# Patient Record
Sex: Female | Born: 1970 | Hispanic: Yes | Marital: Single | Smoking: Never smoker
Health system: Southern US, Community
[De-identification: ages and names within clinical notes are randomized; demographics above are authoritative.]

## PROBLEM LIST (undated history)

## (undated) DIAGNOSIS — G43909 Migraine, unspecified, not intractable, without status migrainosus: Secondary | ICD-10-CM

## (undated) DIAGNOSIS — E782 Mixed hyperlipidemia: Principal | ICD-10-CM

## (undated) DIAGNOSIS — E890 Postprocedural hypothyroidism: Principal | ICD-10-CM

## (undated) DIAGNOSIS — M059 Rheumatoid arthritis with rheumatoid factor, unspecified: Secondary | ICD-10-CM

## (undated) DIAGNOSIS — N950 Postmenopausal bleeding: Secondary | ICD-10-CM

## (undated) DIAGNOSIS — E559 Vitamin D deficiency, unspecified: Secondary | ICD-10-CM

## (undated) DIAGNOSIS — R928 Other abnormal and inconclusive findings on diagnostic imaging of breast: Secondary | ICD-10-CM

## (undated) DIAGNOSIS — R61 Generalized hyperhidrosis: Secondary | ICD-10-CM

## (undated) DIAGNOSIS — F419 Anxiety disorder, unspecified: Secondary | ICD-10-CM

## (undated) DIAGNOSIS — J45909 Unspecified asthma, uncomplicated: Secondary | ICD-10-CM

## (undated) DIAGNOSIS — Z0000 Encounter for general adult medical examination without abnormal findings: Secondary | ICD-10-CM

## (undated) DIAGNOSIS — G8918 Other acute postprocedural pain: Secondary | ICD-10-CM

## (undated) DIAGNOSIS — G4700 Insomnia, unspecified: Secondary | ICD-10-CM

## (undated) DIAGNOSIS — M069 Rheumatoid arthritis, unspecified: Secondary | ICD-10-CM

## (undated) DIAGNOSIS — D259 Leiomyoma of uterus, unspecified: Secondary | ICD-10-CM

## (undated) DIAGNOSIS — E0590 Thyrotoxicosis, unspecified without thyrotoxic crisis or storm: Secondary | ICD-10-CM

## (undated) DIAGNOSIS — F32A Depression, unspecified: Secondary | ICD-10-CM

## (undated) DIAGNOSIS — F329 Major depressive disorder, single episode, unspecified: Secondary | ICD-10-CM

## (undated) DIAGNOSIS — I10 Essential (primary) hypertension: Secondary | ICD-10-CM

## (undated) HISTORY — DX: Anxiety disorder, unspecified: F41.9

## (undated) HISTORY — PX: TUBAL LIGATION: SHX77

## (undated) HISTORY — DX: Depression, unspecified: F32.A

## (undated) HISTORY — DX: Unspecified asthma, uncomplicated: J45.909

## (undated) HISTORY — DX: Essential (primary) hypertension: I10

## (undated) HISTORY — DX: Thyrotoxicosis, unspecified without thyrotoxic crisis or storm: E05.90

---
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
## 2012-07-22 NOTE — Progress Notes (Signed)
Beonca Gibb  07/22/2012      Chief Complaint   Patient presents with   ??? Annual Exam     new patient.Arlee Muslim order needed..pt accepts hpv testing and cultures       HPI: England Greb is a 42 y.o. female G15P0     Past Medical History   Diagnosis Date   ??? Hypertension    ??? Asthma    ??? Headache(784.0)      Past Surgical History   Procedure Laterality Date   ??? Thyroid surgery       06/09/2012   ??? Dental surgery     ??? Tubal ligation       History     Social History   ??? Marital Status: Single     Spouse Name: N/A     Number of Children: N/A   ??? Years of Education: N/A     Occupational History   ??? Not on file.     Social History Main Topics   ??? Smoking status: Never Smoker    ??? Smokeless tobacco: Never Used   ??? Alcohol Use: 0.0 oz/week      Comment: ocassionally   ??? Drug Use: No   ??? Sexual Activity:     Partners: Male     Other Topics Concern   ??? Not on file     Social History Narrative            Medications:  No current outpatient prescriptions on file prior to visit.     No current facility-administered medications on file prior to visit.         Allergies:  Allergies as of 07/22/2012   ??? (No Known Allergies)       No specialty comments available.    OB History   Gravida Para Term Preterm AB SAB TAB Ectopic Multiple Living   5        0 4      # Outcome Date GA Lbr Len/2nd Weight Sex Delivery Anes PTL Lv   5 Gravida 09/11/89    M Vag-Spont   Y   4 Gravida 06/12/87    F Vag-Spont   Y   3 Gravida 02/01/85    M Vag-Spont   Y   2 Gravida 10/17/83    F Vag-Spont   Y   1 Gravida 08/11/81    F Vag-Spont             Review Of Systems:    CONSTITUTIONAL:  negative  EYES:  negative  HEENT:  negative  RESPIRATORY:  negative  CARDIOVASCULAR:  negative  GASTROINTESTINAL:  negative  GENITOURINARY:  negative  INTEGUMENT/BREAST:  negative  HEMATOLOGIC/LYMPHATIC:  negative  ALLERGIC/IMMUNOLOGIC:  negative  ENDOCRINE:  negative  MUSCULOSKELETAL:  negative  NEUROLOGICAL:  negative  BEHAVIOR/PSYCH:  negative    Blood pressure 108/75, pulse  65, weight 160 lb (72.576 kg), last menstrual period 06/29/2012.    General Appearance:  awake, alert, oriented, in no acute distress and well developed, well nourished  Skin:  Skin color, texture, turgor normal. No rashes or lesions.  Mouth/Throat:  deffered  Neck:  neck- supple, no mass, non-tender  Lungs:  No chest wall tenderness.  Heart:  Heart regular rate and rhythm  Breast:  negative, Inspection negative. No nipple discharge or bleeding. No palpable mass, No skin changes or dimpling  Abdomen:  Soft, non-tender, normal bowel sounds.  No bruits, organomegaly or masses.  Extremities: Extremities warm to touch, pink, with  no edema.  Musculoskeletal:  negative  Peripheral Pulses:  Normal  Neurologic:  negative  Female Urogen:  External genitalia, vagina and cervix appear normal and without lesions.  Bimanual exam: no adnexal masses, uterine enlargement or tenderness noted.  Pap obtained.  Exam chaperoned by female MA/Nurse  External genitalia: normal and normal Bartholin, urethral and Skein's glands  Vagina: normal mucosa without prolapse or lesions  Cervix:  no vaginal/perineal skin rash, uterus anteverted, no cervical motion tenderness, no adnexal mass or tenderness, no discharge, and no lesions  Uterus: normal  Adnexae: Normal.  No palpable masses  Female Rectal: Rectal exam deferred      Diagnostics:  na      Lab Results:  No results found for this or any previous visit.            Assessment:   1. Other screening mammogram  MAM Screening Bilateral   2. Routine gynecological examination  PAP SMEAR       There is no problem list on file for this patient.           Plan:  No Follow-up on file.    Annual done /2014  STD and Barrier contraception recommendations reviewed  Preventative Health Maintenance recommendations reviewed  Orders Placed This Encounter   Procedures   ??? MAM Screening Bilateral   ??? PAP SMEAR       Patient was seen with total face to face time of 45 minutes. More than 50% of this visit was on  counseling and education regarding her   1. Other screening mammogram  MAM Screening Bilateral   2. Routine gynecological examination  PAP SMEAR    and her options. She was also counseled on her preventative health maintenance recommendations and follow-up.

## 2012-09-24 NOTE — Progress Notes (Signed)
Results are finalized in the chart  PT HAS 3 CHARTS. MAMMOGRAM RESULTS ARE IN MRN# [id number]

## 2013-07-12 MED ORDER — DOXYCYCLINE MONOHYDRATE 100 MG PO TABS
100 MG | ORAL_TABLET | Freq: Two times a day (BID) | ORAL | Status: AC
Start: 2013-07-12 — End: 2013-07-22

## 2013-07-12 NOTE — Progress Notes (Signed)
Abnormal Uterine Bleeding      Rebecca Lamb is a 43 y.o. woman G5P5005 who presents with complaints of abnormal uterine bleeding as characterized by daily vaginal bleeding for the past month, associated occasionally with blood clots.  The patient has tried antiinflammatories and hormonal interventions with a pain score of dull aching. Bleeding is associated with anxiety and menses. Periods are regular every 30 dys days, lasting 3 days. Dysmenorrhea:mild, occurring throughout menses. Current contraception: none. Tubal ligation in 1991.     Vitals:  BP 114/79    Pulse 79    Ht 5' (1.524 m)    Wt 166 lb (75.297 kg)    BMI 32.42 kg/m2      LMP 06/17/2013     Allergies:  Review of patient's allergies indicates no known allergies.  Past Medical History   Diagnosis Date   ??? Asthma    ??? Migraine    ??? Back pain      Past Surgical History   Procedure Laterality Date   ??? Thyroid surgery       OB History    No data available        Family History   Problem Relation Age of Onset   ??? Cancer Father      brain and lung cancer   ??? Breast Cancer Maternal Aunt    ??? Breast Cancer Paternal Aunt    ??? Breast Cancer Paternal Grandmother    ??? Cervical Cancer Neg Hx    ??? Ovarian Cancer Neg Hx    ??? Uterine Cancer Neg Hx      History     Social History   ??? Marital Status: Married     Spouse Name: N/A     Number of Children: N/A   ??? Years of Education: N/A     Occupational History   ??? Not on file.     Social History Main Topics   ??? Smoking status: Never Smoker    ??? Smokeless tobacco: Never Used   ??? Alcohol Use: 0.0 oz/week     0 Not specified per week   ??? Drug Use: No   ??? Sexual Activity: Not Currently     Other Topics Concern   ??? Not on file     Social History Narrative   ??? No narrative on file       Patient's medications, allergies, past medical, surgical, social and family histories were reviewed and updated as appropriate.    Review of Systems  Review of Systems   Constitutional: Negative for fever and chills.   Respiratory: Negative for  cough and shortness of breath.    Cardiovascular: Negative for chest pain and palpitations.   Gastrointestinal: Negative for nausea and vomiting.   Genitourinary: Negative for dysuria, urgency and frequency.        Vaginal bleeding   Musculoskeletal: Negative for myalgias.   Neurological: Negative for dizziness, seizures and headaches.   Psychiatric/Behavioral: Negative for depression and suicidal ideas.     All other systems reviewed and are negative.    Physical Exam  Physical Exam   Constitutional: She is oriented to person, place, and time and well-developed, well-nourished, and in no distress.   HENT:   Head: Normocephalic and atraumatic.   Eyes: Conjunctivae are normal. Pupils are equal, round, and reactive to light.   Neck: Normal range of motion. Neck supple.   Cardiovascular: Normal rate and regular rhythm.    Pulmonary/Chest: Effort normal and breath sounds normal.  No respiratory distress.   Abdominal: Soft. Bowel sounds are normal.   Genitourinary: Vagina normal, uterus normal and cervix normal. No vaginal discharge found.   Vaginal bleeding.    Musculoskeletal: Normal range of motion.   Neurological: She is alert and oriented to person, place, and time. She has normal reflexes.   Psychiatric: Memory, affect and judgment normal.     EMB  Procedure Details     Urine pregnancy test was done  and result was negative.  The risks (including infection, bleeding, pain, and uterine perforation) and benefits of the procedure were explained to the patient and Verbal informed consent was obtained.  Antibiotic prophylaxis against endocarditis was not indicated.     The patient was placed in the dorsal lithotomy position.  Bimanual exam showed the uterus to be in the retroflexed position.  A Graves' speculum inserted in the vagina, and the cervix prepped with betadine. A sharp tenaculum was applied to the anterior lip of the cervix for stabilization.  A sterile uterine sound was used to sound the uterus to a depth of  8cm.  A Pipelle endometrial aspirator was used to sample the endometrium.  Sample was sent for pathologic examination.         Assessment:     1. Menorrhagia  58100 - PR BIOPSY OF UTERUS LINING   2. Screening procedure  58100 - PR BIOPSY OF UTERUS LINING   3. Papanicolaou smear          Body mass index is 32.42 kg/(m^2).  Obesity:  Class I  Smoking:  No    Plan:   Scheduled pelvic US  Counseled about different treatments for menorrhagia.     Obesity Counseling:  Given  Smoking Counseling:  N/A      Orders Placed This Encounter   Procedures   ??? 58100 - PR BIOPSY OF UTERUS LINING       No orders of the defined types were placed in this encounter.         Follow up:  Return in about 1 week (around 07/19/2013).    Dewain Penning, MD

## 2013-07-12 NOTE — Addendum Note (Signed)
Addended by: Sheryn Bison on: 07/12/2013 02:27 PM     Modules accepted: Orders

## 2013-07-12 NOTE — Addendum Note (Signed)
Addended by: Bernette Mayers on: 07/12/2013 01:48 PM     Modules accepted: Orders

## 2013-07-16 NOTE — Progress Notes (Signed)
Results Review     Rebecca Lamb is a 43 y.o. year old female here to discuss  results.      Vitals:  BP 107/72    Pulse 67    Ht 5' (1.524 m)    Wt 169 lb (76.658 kg)    BMI 33.01 kg/m2      LMP 06/17/2013     Allergies:  Review of patient's allergies indicates no known allergies.  Past Medical History   Diagnosis Date   ??? Asthma    ??? Migraine    ??? Back pain      Past Surgical History   Procedure Laterality Date   ??? Thyroid surgery       OB History    No data available        Family History   Problem Relation Age of Onset   ??? Cancer Father      brain and lung cancer   ??? Breast Cancer Maternal Aunt    ??? Breast Cancer Paternal Aunt    ??? Breast Cancer Paternal Grandmother    ??? Cervical Cancer Neg Hx    ??? Ovarian Cancer Neg Hx    ??? Uterine Cancer Neg Hx      History     Social History   ??? Marital Status: Divorced     Spouse Name: N/A     Number of Children: N/A   ??? Years of Education: N/A     Occupational History   ??? Not on file.     Social History Main Topics   ??? Smoking status: Never Smoker    ??? Smokeless tobacco: Never Used   ??? Alcohol Use: 0.0 oz/week     0 Not specified per week   ??? Drug Use: No   ??? Sexual Activity: Not Currently     Other Topics Concern   ??? Not on file     Social History Narrative       Contraceptive method:  none    Patient's medications, allergies, past medical, surgical, social and family histories were reviewed and updated as appropriate.    Review of Systems  Review of Systems   Constitutional: Negative for fever and chills.   Respiratory: Negative for cough and shortness of breath.    Cardiovascular: Negative for chest pain and palpitations.   Gastrointestinal: Negative for nausea and vomiting.   Genitourinary: Negative for dysuria, urgency and frequency.        Menorrhagia   Musculoskeletal: Negative for myalgias.   Neurological: Negative for dizziness, seizures and headaches.   Psychiatric/Behavioral: Negative for depression and suicidal ideas.      All other systems reviewed and are  negative.    Results:   [place]  The uterus measures 8.7 x 3.9 x 4.4 cm in diameter. The   uterus has a lobular contour. There is a subserosal solid mass, which   is isoechoic to uterine myometrium in the anterior uterine body towards   the fundus, which measures 2.4 x 2.4 x 1.4 cm in diameter. The   appearance is compatible with a subserosal fibroid. There is an   adjacent fibroid, which is intramural and subserosal and measures 2.3 x   2.6 x 2.5 cm in diameter. A third fibroid is identified posteriorly and   is subserosal in location. This measures 2.2 x 2.3 x 2.2 cm in   diameter. There is mild heterogenicity of the uterine myometrium. The   double-wall endometrial thickness is 3 mm.  EMB negative for malignancy      Assessment:     1. Menorrhagia    2. Fibroid uterus           Plan:   > 25 min face to face time  Discussed different treatment modalities for condition. Including expectant management,  medications, Mirena IUD , Novasure ablation and laparoscopic hysterectomy.   Patient wants definitive treatment : hysterectomy.   Patient mentions she has " a heart condition" .   Patient obtain medical clearance and return to schedule surgery .     No orders of the defined types were placed in this encounter.     No orders of the defined types were placed in this encounter.       Follow up:  Return if symptoms worsen or fail to improve, for after medical clearance. .    Risks, benefits and alternative therapies for treatment discussed. Pt elects desires hysterectomy for therapy.         Dewain Penning, MD

## 2016-10-10 ENCOUNTER — Ambulatory Visit (HOSPITAL_BASED_OUTPATIENT_CLINIC_OR_DEPARTMENT_OTHER): Payer: Medicaid Other | Admitting: Internal Medicine

## 2016-10-10 ENCOUNTER — Encounter (HOSPITAL_BASED_OUTPATIENT_CLINIC_OR_DEPARTMENT_OTHER): Payer: Self-pay

## 2016-11-20 LAB — HM MAMMOGRAPHY

## 2016-12-25 ENCOUNTER — Ambulatory Visit (HOSPITAL_BASED_OUTPATIENT_CLINIC_OR_DEPARTMENT_OTHER): Payer: Medicaid Other | Admitting: Rheumatology

## 2017-01-02 NOTE — H&P (Signed)
Reason for Visit: Prior diagnosis of RA with arthralgias    History of the Presenting Illness:  States that she was previously diagnosed with RA while in [place] about 5 years ago. Uncertain as to what kind/name of this MD who made the diagnosis. Was never started on a DMARD for unclear reasons. Notes that her pain symptoms vary with a change in the weather (worsen with a change in either direction of the temperature). Has lower back pain and standing in the office today due to this. However, also states that her right shoulder and right hand are sites of significant pain. Taking ibuprofen with a partial response of her sharp pain symptoms; has dyspepsia with the ibuprofen. Has tried massage and chiropractic manipulations in the past without significant improvement. Pain symptoms are worse with movement/activity. Has AM stiffness at affected sites.  States she has a high pain tolerance. No sensory changes. No focal weakness.    Denies rashes. No history of inflammatory eye disease. Denies significant SOB. Long-standing lower back pain than worsens with activity. Experience back pain more so than back stiffness. Back pain does not radiate into the legs       Review of Systems: Positive for headaches, anxiety, difficulties staying asleep. All others negative except as mentioned in the HPI.    Previous Medical History:  Hypertension  Hypothyroidism  Migraines  Asthma    Previous Surgical History:  None    Family History:  Father and brother with diabetes  Mother with asthma  Daughter with rheumatoid arthritis    Social History:  Non-smoker, rare EtOH, no recreational drug use. 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      Current Outpatient Medications:     albuterol (PROVENTIL) (2.5 MG/3ML) 0.083% nebulization, 2.5 mg by Nebulization route every 4 hours as needed for Wheezing., Disp: , Rfl:     EPINEPHrine (EPIPEN 2-PAK) 0.3 MG/0.3ML injection, 0.3 mg by IntraMUSCULAR route once as needed., Disp: , Rfl:      fluticasone propionate (FLONASE) 50 MCG/ACT nasal spray, Spray 1 spray into each nostril daily., Disp: , Rfl:     ibuprofen (MOTRIN) 800 MG tablet, Take 800 mg by mouth every 6 hours as needed for Mild Pain (Pain Score 1-3)., Disp: , Rfl:     levothyroxine (SYNTHROID) 175 MCG tablet, Take 175 mcg by mouth every morning (before breakfast)., Disp: , Rfl:     levothyroxine (SYNTHROID) 200 MCG tablet, Take 200 mcg by mouth every morning (before breakfast)., Disp: , Rfl:     methocarbamol (ROBAXIN) 500 MG tablet, Take 500 mg by mouth 3 times daily., Disp: , Rfl:     metoprolol tartrate (LOPRESSOR) 50 MG tablet, Take 50 mg by mouth 2 times daily., Disp: , Rfl:     montelukast (SINGULAIR) 10 MG tablet, Take 10 mg by mouth every evening., Disp: , Rfl:     sumatriptan (IMITREX) 100 MG tablet, Take 100 mg by mouth once as needed for Migraine. 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   Skin: No rash.       Lab Data and Imaging:  Lab Results   Component Value Date    WBC 10.6 (H) 01/03/2017    RBC 4.54 01/03/2017    HGB 14.3 01/03/2017    HCT 42.7 01/03/2017    MCV 94.1 01/03/2017    MCHC 33.5 01/03/2017    RDW 12.6 01/03/2017    PLT 289 01/03/2017    MPV 9.3 (L) 01/03/2017    SEG 66 01/03/2017    LYMPHS 24 01/03/2017    MONOS 7 01/03/2017    EOS 2 01/03/2017    BASOS 0 01/03/2017     01/03/2017  ESR 19, CRP 0.2  RF - 15 (<13)  Anti-CCP Ab - pending    HepBs Ag - negative  HepBc Ab - negative  HepBs Ab - <3.5  HepC Ab -  non-reactive  HIV - non-reactive    05/31/2016  ESR 31, CRP 4.6 (<8)  ANA - negative.  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 The study is otherwise normal with no evidence of erosive or other arthritis. No significant soft tissue swelling.  Right hand; there is again noted to be a relatively short little finger middle phalanx. Possible tiny erosion on the ulnar side at the base of the middle finger proximal phalanx, but no other evidence of erosions. No significant soft tissue swelling.    Assessment and Plan:  Mrs. Baugher is presenting to the rheumatology clinic given a prior history of RA. Prior diagnosis made in the timeframe of 2013 in [place]; has never been on directed therapy for RA. Has long-standing, mechanical back pain but also with peripheral arthralgias involving large, medium and small joints. No history of rashes. Without ocular or bowel symptoms. With synovitis on exam and a small erosion at the base of the right 5th proximal phalanx as per x-rays at time of this visit. Markers of inflammation do not seem to be a good indicator of disease activity in this individual (ESR and CPR normal with synovitis on exam). Rheumatoid arthritis seems to be the most likely etiology of her arthralgias.    -Ordered baseline labs in anticipation of starting on a DMARD (CBC, CMP, ESR, CRP, RF, anti-CCP Abs, HepB/C serologies, HIV). Ordered baseline x-rays of the hands. Available results reviewed and as per above.  -Advised that the plan at this time is to likely start on methotrexate at time of follow-up in 2 weeks and to read about this medication    >60 minutes was spent face to face with the patient for the purpose of clarification of prior history, educating on a diagnosis of RA and potential next steps.

## 2017-01-03 ENCOUNTER — Inpatient Hospital Stay (INDEPENDENT_AMBULATORY_CARE_PROVIDER_SITE_OTHER)
Admit: 2017-01-03 | Discharge: 2017-01-03 | Disposition: A | Discharge status: Home Health | Payer: Medicaid Other | Attending: Rheumatology | Admitting: Rheumatology

## 2017-01-03 ENCOUNTER — Other Ambulatory Visit: Payer: Medicaid Other | Attending: Rheumatology | Admitting: Rheumatology

## 2017-01-03 ENCOUNTER — Inpatient Hospital Stay (INDEPENDENT_AMBULATORY_CARE_PROVIDER_SITE_OTHER): Admit: 2017-01-03 | Discharge: 2017-01-03 | Disposition: A | Discharge status: Home Health | Payer: Medicaid Other

## 2017-01-03 ENCOUNTER — Encounter (INDEPENDENT_AMBULATORY_CARE_PROVIDER_SITE_OTHER): Payer: Self-pay | Admitting: Rheumatology

## 2017-01-03 VITALS — BP 122/81 | HR 96 | Temp 98.1°F | Resp 16 | Ht 62.0 in | Wt 179.0 lb

## 2017-01-03 DIAGNOSIS — M545 Low back pain: Secondary | ICD-10-CM

## 2017-01-03 DIAGNOSIS — M659 Synovitis and tenosynovitis, unspecified: Secondary | ICD-10-CM

## 2017-01-03 DIAGNOSIS — R700 Elevated erythrocyte sedimentation rate: Secondary | ICD-10-CM

## 2017-01-03 DIAGNOSIS — M2550 Pain in unspecified joint: Principal | ICD-10-CM

## 2017-01-03 DIAGNOSIS — Q681 Congenital deformity of finger(s) and hand: Secondary | ICD-10-CM

## 2017-01-03 DIAGNOSIS — M25541 Pain in joints of right hand: Secondary | ICD-10-CM

## 2017-01-03 LAB — HEPATITIS B SURFACE AG, BLOOD: HBsAg: NONREACTIVE

## 2017-01-03 LAB — COMPREHENSIVE METABOLIC PANEL, BLOOD
ALT (SGPT): 10 U/L (ref 0–33)
AST (SGOT): 16 U/L (ref 0–32)
Albumin: 4.3 g/dL (ref 3.5–5.2)
Alkaline Phos: 66 U/L (ref 35–140)
Anion Gap: 13 mmol/L (ref 7–15)
BUN: 11 mg/dL (ref 6–20)
Bicarbonate: 25 mmol/L (ref 22–29)
Bilirubin, Tot: <0.15 mg/dL (ref <1.2)
Calcium: 9.3 mg/dL (ref 8.5–10.6)
Chloride: 104 mmol/L (ref 98–107)
Creatinine: 0.71 mg/dL (ref 0.51–0.95)
GFR: >60 mL/min
Glucose: 113 mg/dL — ABNORMAL HIGH (ref 70–99)
Potassium: 4.2 mmol/L (ref 3.5–5.1)
Sodium: 142 mmol/L (ref 136–145)
Total Protein: 7.7 g/dL (ref 6.0–8.0)

## 2017-01-03 LAB — CBC WITH DIFF, BLOOD
ANC-Automated: 7 10*3/uL (ref 1.6–7.0)
Abs Basophils: 0 10*3/uL (ref <0.1)
Abs Eosinophils: 0.2 10*3/uL (ref 0.1–0.5)
Abs Lymphs: 2.6 10*3/uL (ref 0.8–3.1)
Abs Monos: 0.8 10*3/uL (ref 0.2–0.8)
Basophils: 0 %
Eosinophils: 2 %
Hct: 42.7 % (ref 34.0–45.0)
Hgb: 14.3 gm/dL (ref 11.2–15.7)
Lymphocytes: 24 %
MCH: 31.5 pg (ref 26.0–32.0)
MCHC: 33.5 g/dL (ref 32.0–36.0)
MCV: 94.1 um3 (ref 79.0–95.0)
MPV: 9.3 fL — ABNORMAL LOW (ref 9.4–12.4)
Monocytes: 7 %
Plt Count: 289 10*3/uL (ref 140–370)
RBC: 4.54 10*6/uL (ref 3.90–5.20)
RDW: 12.6 % (ref 12.0–14.0)
Segs: 66 %
WBC: 10.6 10*3/uL — ABNORMAL HIGH (ref 4.0–10.0)

## 2017-01-03 LAB — SED RATE, BLOOD: Sed Rate: 19 mm/hr (ref 0–20)

## 2017-01-03 LAB — HEPATITIS B CORE AB TOTAL: HBcAb Total: NONREACTIVE

## 2017-01-03 LAB — RF (RHEUMATOID FACTOR), BLOOD: RF: 15 [IU]/mL — ABNORMAL HIGH (ref 0–13)

## 2017-01-03 LAB — C-REACTIVE PROTEIN, BLOOD: CRP: 0.2 mg/dL (ref <0.5)

## 2017-01-03 LAB — HEPATITIS B SURFACE AB, QUANT, BLOOD: HBsAb,Qt: <3.5 m[IU]/mL

## 2017-01-03 LAB — HEPATITIS C AB, BLOOD: Hepatitis C Ab: NONREACTIVE

## 2017-01-03 LAB — HIV 1/2 ANTIBODY & P24 ANTIGEN ASSAY, BLOOD: HIV 1/2 Antibody & P24 Antigen Assay: NONREACTIVE

## 2017-01-03 MED ORDER — LEVOTHYROXINE SODIUM 175 MCG OR TABS
175.00 ug | ORAL_TABLET | Freq: Every day | ORAL | Status: DC
Start: ? — End: 2017-01-03

## 2017-01-03 MED ORDER — METOPROLOL TARTRATE 50 MG OR TABS: 50.00 mg | ORAL_TABLET | Freq: Two times a day (BID) | ORAL | Status: AC

## 2017-01-03 MED ORDER — SUMATRIPTAN SUCCINATE 100 MG OR TABS: 100.00 mg | ORAL_TABLET | Freq: Once | ORAL | Status: AC | PRN

## 2017-01-03 MED ORDER — IBUPROFEN 800 MG OR TABS: 800.00 mg | ORAL_TABLET | Freq: Four times a day (QID) | ORAL | Status: AC | PRN

## 2017-01-03 MED ORDER — EPINEPHRINE 0.3 MG/0.3ML IJ SOAJ: 0.30 mg | Freq: Once | INTRAMUSCULAR | Status: AC | PRN

## 2017-01-03 MED ORDER — METHOCARBAMOL 500 MG OR TABS: 500.00 mg | ORAL_TABLET | Freq: Three times a day (TID) | ORAL | Status: AC

## 2017-01-03 MED ORDER — LEVOTHYROXINE SODIUM 200 MCG OR TABS: 200.00 ug | ORAL_TABLET | Freq: Every day | ORAL | Status: AC

## 2017-01-03 MED ORDER — FLUTICASONE PROPIONATE 50 MCG/ACT NA SUSP: 1.00 | Freq: Every day | NASAL | Status: AC

## 2017-01-03 MED ORDER — MONTELUKAST SODIUM 10 MG OR TABS: 10.00 mg | ORAL_TABLET | Freq: Every evening | ORAL | Status: AC

## 2017-01-03 MED ORDER — ALBUTEROL SULFATE (2.5 MG/3ML) 0.083% IN NEBU: 2.50 mg | INHALATION_SOLUTION | RESPIRATORY_TRACT | Status: AC | PRN

## 2017-01-03 NOTE — Patient Instructions (Addendum)
Get labs and labs  Read about methotrexate.

## 2017-01-03 NOTE — Interdisciplinary (Signed)
Blood drawn from left arm with 23 gauge needle. 4 (sst,pst,lavender) tubes taken.   Patient identity authenticated by Gerrie NordmannAdriana Antwion Carpenter, LVN.

## 2017-01-09 LAB — CYCLIC CITRUL PEP AB, IGG: Cyclic Citrul PEP AB, IGG: <8 U/mL

## 2017-01-16 NOTE — Progress Notes (Deleted)
Reason for Visit: Prior diagnosis of RA with arthralgias      History of the Presenting Illness:    Review of Systems:  Positive for headaches, anxiety, difficulties staying asleep. All others negative except as mentioned in the HPI.     Previous Medical History:  Hypertension  Hypothyroidism  Migraines  Asthma     Previous Surgical History:  None     Family History:  Father and brother with diabetes  Mother with asthma  Daughter with rheumatoid arthritis     Social History:  Non-smoker, rare EtOH, no recreational drug use. 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   Skin: No rash.         Lab Data and Imaging:    01/03/2017  ESR 19, CRP 0.2  RF - 15 (<13)  Anti-CCP Ab - <8     HepBs Ag - negative  HepBc Ab - negative  HepBs Ab - <3.5  HepC Ab - non-reactive  HIV - non-reactive     05/31/2016  ESR 31, CRP 4.6 (<8)  ANA - negative.  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 The study is otherwise normal with no evidence of erosive or other arthritis. No significant soft tissue swelling.  Right hand; there is again noted to be a relatively short little finger middle phalanx. Possible tiny erosion on the ulnar side at the base of the middle finger proximal phalanx, but no other evidence of erosions. No significant soft tissue swelling.     Assessment and Plan:  Amber Campbell is presenting to the rheumatology clinic given a prior history of RA. Prior diagnosis made in the timeframe of 2013 in [place]; has never been on directed therapy for RA. Has  long-standing, mechanical back pain but also with peripheral arthralgias involving large, medium and small joints. No history of rashes. Without ocular or bowel symptoms. With synovitis on exam and a small erosion at the base of the right 5th proximal phalanx as per x-rays at time of this visit. Markers of inflammation do not seem to be a good indicator of disease activity in this individual (ESR and CPR normal with synovitis on exam). Rheumatoid arthritis seems to be the most likely etiology of her arthralgias.     -Ordered baseline labs in anticipation of starting on a DMARD (CBC, CMP, ESR, CRP, RF, anti-CCP Abs, HepB/C serologies, HIV). Ordered baseline x-rays of the hands. Available results reviewed and as per above.  -Advised that the plan at this time is to likely start on methotrexate at time of follow-up in 2 weeks and to read about this medication          

## 2017-01-17 ENCOUNTER — Encounter (INDEPENDENT_AMBULATORY_CARE_PROVIDER_SITE_OTHER): Payer: Medicaid Other | Admitting: Rheumatology

## 2019-04-06 ENCOUNTER — Encounter (INDEPENDENT_AMBULATORY_CARE_PROVIDER_SITE_OTHER): Payer: Self-pay

## 2019-05-25 ENCOUNTER — Other Ambulatory Visit: Payer: Self-pay

## 2019-05-25 ENCOUNTER — Encounter: Payer: Self-pay | Admitting: Family Medicine

## 2019-05-25 ENCOUNTER — Ambulatory Visit (INDEPENDENT_AMBULATORY_CARE_PROVIDER_SITE_OTHER): Payer: Medicaid Other | Admitting: Family Medicine

## 2019-05-25 VITALS — BP 126/88 | HR 80 | Temp 98.0°F | Ht <= 58 in | Wt 172.2 lb

## 2019-05-25 DIAGNOSIS — G8929 Other chronic pain: Secondary | ICD-10-CM

## 2019-05-25 DIAGNOSIS — M2550 Pain in unspecified joint: Secondary | ICD-10-CM

## 2019-05-25 DIAGNOSIS — E890 Postprocedural hypothyroidism: Secondary | ICD-10-CM

## 2019-05-25 DIAGNOSIS — M545 Low back pain, unspecified: Secondary | ICD-10-CM

## 2019-05-25 DIAGNOSIS — G43809 Other migraine, not intractable, without status migrainosus: Secondary | ICD-10-CM | POA: Diagnosis not present

## 2019-05-25 DIAGNOSIS — F419 Anxiety disorder, unspecified: Secondary | ICD-10-CM | POA: Diagnosis not present

## 2019-05-25 DIAGNOSIS — I10 Essential (primary) hypertension: Secondary | ICD-10-CM

## 2019-05-25 MED ORDER — MELOXICAM 7.5 MG PO TABS
7.5000 mg | ORAL_TABLET | Freq: Every day | ORAL | 0 refills | Status: DC
Start: 2019-05-25 — End: 2019-05-25

## 2019-05-25 MED ORDER — MELOXICAM 7.5 MG PO TABS: 7.5000 mg | ORAL_TABLET | Freq: Every day | ORAL | 0 refills | Status: DC

## 2019-05-25 NOTE — Patient Instructions (Signed)
Great to see you today! I have entered referrals for psychiatry and neurology. You will be contacted for appointment.  Please have labs completed today, we'll be in touch with results.  Try meloxicam for joint pain.

## 2019-05-26 ENCOUNTER — Ambulatory Visit (INDEPENDENT_AMBULATORY_CARE_PROVIDER_SITE_OTHER): Payer: Medicaid Other

## 2019-05-26 DIAGNOSIS — M545 Low back pain: Secondary | ICD-10-CM | POA: Diagnosis not present

## 2019-05-26 DIAGNOSIS — G8929 Other chronic pain: Secondary | ICD-10-CM | POA: Diagnosis not present

## 2019-05-26 LAB — COMPLETE METABOLIC PANEL WITH GFR
AG Ratio: 1.4 (calc) (ref 1.0–2.5)
ALT: 11 U/L (ref 6–29)
AST: 15 U/L (ref 10–35)
Albumin: 4.2 g/dL (ref 3.6–5.1)
Alkaline phosphatase (APISO): 52 U/L (ref 37–153)
BUN: 11 mg/dL (ref 7–25)
CO2: 27 mmol/L (ref 20–32)
Calcium: 8.4 mg/dL — ABNORMAL LOW (ref 8.6–10.4)
Chloride: 105 mmol/L (ref 98–110)
Creat: 0.7 mg/dL (ref 0.50–1.05)
GFR, Est African American: 117 mL/min/{1.73_m2} (ref >=60)
GFR, Est Non African American: 101 mL/min/{1.73_m2} (ref >=60)
Globulin: 3.1 g/dL (calc) (ref 1.9–3.7)
Glucose, Bld: 94 mg/dL (ref 65–139)
Potassium: 3.9 mmol/L (ref 3.5–5.3)
Sodium: 140 mmol/L (ref 135–146)
Total Bilirubin: 0.7 mg/dL (ref 0.2–1.2)
Total Protein: 7.3 g/dL (ref 6.1–8.1)

## 2019-05-26 LAB — CBC
HCT: 41.6 % (ref 35.0–45.0)
Hemoglobin: 14.1 g/dL (ref 11.7–15.5)
MCH: 32.2 pg (ref 27.0–33.0)
MCHC: 33.9 g/dL (ref 32.0–36.0)
MCV: 95 fL (ref 80.0–100.0)
MPV: 9.1 fL (ref 7.5–12.5)
Platelets: 249 10*3/uL (ref 140–400)
RBC: 4.38 10*6/uL (ref 3.80–5.10)
RDW: 13 % (ref 11.0–15.0)
WBC: 6.9 10*3/uL (ref 3.8–10.8)

## 2019-05-26 LAB — SEDIMENTATION RATE: Sed Rate: 29 mm/h — ABNORMAL HIGH (ref 0–20)

## 2019-05-26 LAB — TSH: TSH: 21.12 mIU/L — ABNORMAL HIGH

## 2019-05-26 LAB — URIC ACID: Uric Acid, Serum: 5 mg/dL (ref 2.5–7.0)

## 2019-05-26 LAB — C-REACTIVE PROTEIN: CRP: 3.9 mg/L (ref <8.0)

## 2019-05-27 DIAGNOSIS — M5136 Other intervertebral disc degeneration, lumbar region: Secondary | ICD-10-CM | POA: Insufficient documentation

## 2019-05-27 DIAGNOSIS — M2550 Pain in unspecified joint: Secondary | ICD-10-CM | POA: Insufficient documentation

## 2019-05-27 DIAGNOSIS — G43909 Migraine, unspecified, not intractable, without status migrainosus: Secondary | ICD-10-CM | POA: Insufficient documentation

## 2019-05-27 DIAGNOSIS — I10 Essential (primary) hypertension: Secondary | ICD-10-CM | POA: Insufficient documentation

## 2019-05-27 DIAGNOSIS — E890 Postprocedural hypothyroidism: Secondary | ICD-10-CM | POA: Insufficient documentation

## 2019-05-27 DIAGNOSIS — F419 Anxiety disorder, unspecified: Secondary | ICD-10-CM | POA: Insufficient documentation

## 2019-05-27 NOTE — Progress Notes (Signed)
ELLINGTON CORNIA - 49 y.o. female MRN [id number]  Date of birth: Aug 11, 1970  Subjective Chief Complaint  Patient presents with  . Establish Care    HPI BANESSA MAO is a 49 y.o. female here today for initial visit.  She has history of migraines, hypothyroidism, HTN, depression and anxiety.  She has also had some pain and swelling in her hands along with pain in her lower back.    -HTN:  Current management with HCTZ and metoprolol.  She has done well with this.  She does have history of migraines but denies increased headaches.  She has not had chest pain, shortness of breath, palpitations, or vision changes.   -Hypothyroidism:  Currently taking levothyroxine 200 mcg daily.  Has had some fatigue and weight gain  but no other symptoms at this time.     -Back/Joint Pain:  Increased back and joint pain with stiffness in bilateral hands.  Back pain is non radiating.    -Depression/anxiety:  Reports increased stress related to caring for her son and grandson. She is not currently on any medication and would like referral to psychiatry.   -Migraines:  History of migraines.  Improved with metoprolol.  She is not on any abortive medications.  She requests referral to neuro for continued management of this.    ROS:  A comprehensive ROS was completed and negative except as noted per HPI  Not on File  Past Medical History:  Diagnosis Date  . Anxiety   . Asthma   . Depression   . Hypertension   . Hyperthyroidism     Past Surgical History:  Procedure Laterality Date  . TUBAL LIGATION      Social History   Socioeconomic History  . Marital status: Single    Spouse name: Not on file  . Number of children: Not on file  . Years of education: Not on file  . Highest education level: Not on file  Occupational History  . Not on file  Tobacco Use  . Smoking status: Never Smoker  . Smokeless tobacco: Never Used  Substance and Sexual Activity  . Alcohol use: Yes    Alcohol/week: 1.0  standard drinks    Types: 1 Standard drinks or equivalent per week  . Drug use: Never  . Sexual activity: Not Currently    Partners: Female  Other Topics Concern  . Not on file  Social History Narrative  . Not on file   Social Determinants of Health   Financial Resource Strain:   . Difficulty of Paying Living Expenses:   Food Insecurity:   . Worried About Charity fundraiser in the Last Year:   . Arboriculturist in the Last Year:   Transportation Needs:   . Film/video editor (Medical):   Marland Kitchen Lack of Transportation (Non-Medical):   Physical Activity:   . Days of Exercise per Week:   . Minutes of Exercise per Session:   Stress:   . Feeling of Stress :   Social Connections:   . Frequency of Communication with Friends and Family:   . Frequency of Social Gatherings with Friends and Family:   . Attends Religious Services:   . Active Member of Clubs or Organizations:   . Attends Archivist Meetings:   Marland Kitchen Marital Status:     Family History  Problem Relation Age of Onset  . Hypertension Mother   . Diabetes Father   . Brain cancer Father   . Lung cancer  Father   . Diabetes Brother   . Breast cancer Maternal Aunt     Health Maintenance  Topic Date Due  . HIV Screening  Never done  . COVID-19 Vaccine (1) Never done  . TETANUS/TDAP  Never done  . PAP SMEAR-Modifier  Never done  . MAMMOGRAM  Never done  . COLONOSCOPY  Never done  . INFLUENZA VACCINE  08/08/2019     ----------------------------------------------------------------------------------------------------------------------------------------------------------------------------------------------------------------- Physical Exam BP 126/88 (BP Location: Left Arm, Patient Position: Sitting, Cuff Size: Normal)   Pulse 80   Temp 98 F (36.7 C) (Oral)   Ht 4' 10"  (1.473 m)   Wt 172 lb 3.2 oz (78.1 kg)   SpO2 95%   BMI 35.99 kg/m   Physical Exam Constitutional:      Appearance: Normal appearance.   HENT:     Head: Normocephalic and atraumatic.  Eyes:     General: No scleral icterus. Cardiovascular:     Rate and Rhythm: Normal rate and regular rhythm.  Pulmonary:     Effort: Pulmonary effort is normal.     Breath sounds: Normal breath sounds.  Musculoskeletal:     Cervical back: Neck supple.     Comments: ttp along bilateral hands.   Mildly + tinel at carpal tunnel and increased pain with median nerve compression   L spine non tender. Negative SLR.   Skin:    General: Skin is warm and dry.  Neurological:     General: No focal deficit present.     Mental Status: She is alert.     ------------------------------------------------------------------------------------------------------------------------------------------------------------------------------------------------------------------- Assessment and Plan  Essential hypertension Blood pressure is at goal at for age and co-morbidities.  I recommend she continue current medications.  In addition they were instructed to follow a low sodium diet with regular exercise to help to maintain adequate control of blood pressure.    Migraine Some improvement with metoprolol.  Referral placed to neuro per her request.   Postoperative hypothyroidism Update TSH  Chronic bilateral low back pain without sciatica Xray ordered.  Start meloxicam 7.38m daily as needed.   Arthralgia No appreciable effusion or synovitis noted today.  Will check esr, crp and uric acid.  Anxiety Requesting referral to psychiatry, orders entered.    Meds ordered this encounter  Medications  . DISCONTD: meloxicam (MOBIC) 7.5 MG tablet    Sig: Take 1 tablet (7.5 mg total) by mouth daily.    Dispense:  30 tablet    Refill:  0  . meloxicam (MOBIC) 7.5 MG tablet    Sig: Take 1 tablet (7.5 mg total) by mouth daily.    Dispense:  30 tablet    Refill:  0    No follow-ups on file.    This visit occurred during the SARS-CoV-2 public health  emergency.  Safety protocols were in place, including screening questions prior to the visit, additional usage of staff PPE, and extensive cleaning of exam room while observing appropriate contact time as indicated for disinfecting solutions.

## 2019-05-27 NOTE — Assessment & Plan Note (Signed)
No appreciable effusion or synovitis noted today.  Will check esr, crp and uric acid.

## 2019-05-27 NOTE — Assessment & Plan Note (Signed)
Some improvement with metoprolol.  Referral placed to neuro per her request.

## 2019-05-27 NOTE — Assessment & Plan Note (Signed)
Blood pressure is at goal at for age and co-morbidities.  I recommend she continue current medications.  In addition they were instructed to follow a low sodium diet with regular exercise to help to maintain adequate control of blood pressure.   

## 2019-05-27 NOTE — Assessment & Plan Note (Signed)
Update TSH

## 2019-05-27 NOTE — Assessment & Plan Note (Signed)
Xray ordered.  Start meloxicam 7.5mg  daily as needed.

## 2019-05-27 NOTE — Assessment & Plan Note (Signed)
Requesting referral to psychiatry, orders entered.

## 2019-06-09 ENCOUNTER — Encounter: Payer: Self-pay | Admitting: Sports Medicine

## 2019-06-09 ENCOUNTER — Ambulatory Visit (INDEPENDENT_AMBULATORY_CARE_PROVIDER_SITE_OTHER): Payer: Medicaid Other | Admitting: Sports Medicine

## 2019-06-09 ENCOUNTER — Other Ambulatory Visit: Payer: Self-pay

## 2019-06-09 DIAGNOSIS — Z6835 Body mass index (BMI) 35.0-35.9, adult: Secondary | ICD-10-CM | POA: Diagnosis not present

## 2019-06-09 DIAGNOSIS — M5136 Other intervertebral disc degeneration, lumbar region: Secondary | ICD-10-CM

## 2019-06-09 DIAGNOSIS — M5412 Radiculopathy, cervical region: Secondary | ICD-10-CM

## 2019-06-09 DIAGNOSIS — E669 Obesity, unspecified: Secondary | ICD-10-CM | POA: Insufficient documentation

## 2019-06-09 DIAGNOSIS — M51369 Other intervertebral disc degeneration, lumbar region without mention of lumbar back pain or lower extremity pain: Secondary | ICD-10-CM

## 2019-06-09 DIAGNOSIS — E66812 Obesity, class 2: Secondary | ICD-10-CM

## 2019-06-09 MED ORDER — MELOXICAM 15 MG PO TABS: ORAL_TABLET | ORAL | 3 refills | Status: AC

## 2019-06-09 MED ORDER — PREDNISONE 50 MG PO TABS: ORAL_TABLET | ORAL | 0 refills | Status: AC

## 2019-06-09 NOTE — Assessment & Plan Note (Signed)
This is not helping her back, referral [facility].

## 2019-06-09 NOTE — Assessment & Plan Note (Signed)
Brittany Garza has also suffered from neck pain with radiation down the right arm to the dorsum of the right forearm without going to the fingers. As below we are going to do formal PT, prednisone, meloxicam, return to see me in 4 to 6 weeks, MRI for interventional planning if no better, of note she has had lumbar epidurals in the past in [place].

## 2019-06-09 NOTE — Assessment & Plan Note (Signed)
This is a pleasant 49 year old female, she has struggled with a lifetime of chronic back pain. Axial, discogenic, with radiation down the right leg to the middle toes in an L5 distribution. She has been treated with epidurals in the past in [place]. We are going to start from scratch with prednisone, followed by high-dose meloxicam. Adding home rehab exercises, she declines physical therapy for now. I would also like her to work with the [facility] on weight loss.

## 2019-06-09 NOTE — Progress Notes (Signed)
    Procedures performed today:    None.  Independent interpretation of notes and tests performed by another provider:   None.  Brief History, Exam, Impression, and Recommendations:    Lumbar degenerative disc disease This is a pleasant 49 year old female, she has struggled with a lifetime of chronic back pain. Axial, discogenic, with radiation down the right leg to the middle toes in an L5 distribution. She has been treated with epidurals in the past in [place]. We are going to start from scratch with prednisone, followed by high-dose meloxicam. Adding home rehab exercises, she declines physical therapy for now. I would also like her to work with the [facility] on weight loss.   Radiculitis of right cervical region Haydyn has also suffered from neck pain with radiation down the right arm to the dorsum of the right forearm without going to the fingers. As below we are going to do formal PT, prednisone, meloxicam, return to see me in 4 to 6 weeks, MRI for interventional planning if no better, of note she has had lumbar epidurals in the past in [place].  Obesity This is not helping her back, referral to the healthy weight [facility] wellness center.    ___________________________________________ Ihor Austin. Benjamin Stain, M.D., ABFM., CAQSM. Primary Care and Sports Medicine [facility]  Adjunct Instructor of Family Medicine  [employer]

## 2019-06-10 ENCOUNTER — Ambulatory Visit (INDEPENDENT_AMBULATORY_CARE_PROVIDER_SITE_OTHER): Payer: Medicaid Other

## 2019-06-10 DIAGNOSIS — M5412 Radiculopathy, cervical region: Secondary | ICD-10-CM

## 2019-06-14 ENCOUNTER — Telehealth (HOSPITAL_COMMUNITY): Payer: Medicaid Other | Admitting: Psychiatry

## 2019-07-07 ENCOUNTER — Ambulatory Visit: Payer: Medicaid Other | Admitting: Sports Medicine

## 2019-07-19 NOTE — Progress Notes (Deleted)
[facility]    Provider:  Dr Lucia Gaskins Requesting Provider: Everrett Coombe, DO Primary Care Provider:  Everrett Coombe, DO  CC:  ***  HPI:  Brittany Garza is a 49 y.o. female here as requested by Everrett Coombe, DO for migraine. PMHx hypothyroidism status post surgery with postoperative hypothyroidism, asthma, hypertension, depression, asthma, anxiety, radiculitis of the right cervical region, lumbar degenerative disc disease, obesity.  I reviewed Dr. Ashley Royalty notes: Patient has increased stress related to caring for her son and grandson, history of migraines improved with metoprolol, not on any abortive/acute management for this, she was referred to neurology for migraines and she also has associated neck pain which is chronic.  She also recently saw physician again, for her obesity she was referred to the [facility].  She was also referred to psychiatry for her anxiety and stress.  Reviewed notes, labs and imaging from outside physicians, which showed:  I reviewed labs collected May 2021 which showed unremarkable CMP, normal CBC, TSH abnormal 21.12, sed rate 29 abnormal, CRP within normal limits.  I reviewed images of XR of the cervical spine and agree with the following:  FINDINGS: Straightening of the cervical spine. C5-C6 disc degeneration with endplate osteophyte formation. Mild narrowing of the right C5-C6 neural foramen. Diffuse mild facet hypertrophy. No acute bony abnormality. Pulmonary apices are clear.  IMPRESSION: C5-C6 disc degeneration with endplate osteophyte formation. Mild narrowing of the right C5-C6 neural foramen at this level. Diffuse mild facet hypertrophy. No acute abnormality.   Review of Systems: Patient complains of symptoms per HPI as well as the following symptoms ***. Pertinent negatives and positives per HPI. All others negative.   Social History   Socioeconomic History  . Marital status: Single    Spouse  name: Not on file  . Number of children: Not on file  . Years of education: Not on file  . Highest education level: Not on file  Occupational History  . Not on file  Tobacco Use  . Smoking status: Never Smoker  . Smokeless tobacco: Never Used  Vaping Use  . Vaping Use: Never used  Substance and Sexual Activity  . Alcohol use: Yes    Alcohol/week: 1.0 standard drink    Types: 1 Standard drinks or equivalent per week  . Drug use: Never  . Sexual activity: Not Currently    Partners: Female  Other Topics Concern  . Not on file  Social History Narrative  . Not on file   Social Determinants of Health   Financial Resource Strain:   . Difficulty of Paying Living Expenses:   Food Insecurity:   . Worried About Programme researcher, broadcasting/film/video in the Last Year:   . Barista in the Last Year:   Transportation Needs:   . Freight forwarder (Medical):   Marland Kitchen Lack of Transportation (Non-Medical):   Physical Activity:   . Days of Exercise per Week:   . Minutes of Exercise per Session:   Stress:   . Feeling of Stress :   Social Connections:   . Frequency of Communication with Friends and Family:   . Frequency of Social Gatherings with Friends and Family:   . Attends Religious Services:   . Active Member of Clubs or Organizations:   . Attends Banker Meetings:   Marland Kitchen Marital Status:   Intimate Partner Violence:   . Fear of Current or Ex-Partner:   . Emotionally Abused:   Marland Kitchen Physically Abused:   .  Sexually Abused:     Family History  Problem Relation Age of Onset  . Hypertension Mother   . Diabetes Father   . Brain cancer Father   . Lung cancer Father   . Diabetes Brother   . Breast cancer Maternal Aunt     Past Medical History:  Diagnosis Date  . Anxiety   . Asthma   . Depression   . Hypertension   . Hyperthyroidism     Patient Active Problem List   Diagnosis Date Noted  . Radiculitis of right cervical region 06/09/2019  . Obesity 06/09/2019  . Migraine  05/27/2019  . Anxiety 05/27/2019  . Lumbar degenerative disc disease 05/27/2019  . Arthralgia 05/27/2019  . Postoperative hypothyroidism 05/27/2019  . Essential hypertension 05/27/2019    Past Surgical History:  Procedure Laterality Date  . TUBAL LIGATION      Current Outpatient Medications  Medication Sig Dispense Refill  . albuterol (PROVENTIL) (2.5 MG/3ML) 0.083% nebulizer solution Take 2.5 mg by nebulization every 6 (six) hours as needed for wheezing or shortness of breath.    Marland Kitchen albuterol (VENTOLIN HFA) 108 (90 Base) MCG/ACT inhaler Inhale 2 puffs into the lungs every 6 (six) hours as needed for wheezing or shortness of breath.    . beclomethasone (QVAR REDIHALER) 80 MCG/ACT inhaler Inhale 2 puffs into the lungs 2 (two) times daily.    Marland Kitchen EPINEPHrine 0.3 mg/0.3 mL IJ SOAJ injection Inject 0.3 mg into the muscle as needed for anaphylaxis.    . hydrochlorothiazide (MICROZIDE) 12.5 MG capsule Take 12.5 mg by mouth daily.    Marland Kitchen levothyroxine (SYNTHROID) 200 MCG tablet Take 200 mcg by mouth daily before breakfast.    . meloxicam (MOBIC) 15 MG tablet One tab PO qAM with a meal for 2 weeks, then daily prn pain. 30 tablet 3  . metoprolol tartrate (LOPRESSOR) 50 MG tablet Take 50 mg by mouth daily.    . predniSONE (DELTASONE) 50 MG tablet One tab PO daily for 5 days. 5 tablet 0   No current facility-administered medications for this visit.    Allergies as of 07/20/2019  . (No Known Allergies)    Vitals: There were no vitals taken for this visit. Last Weight:  Wt Readings from Last 1 Encounters:  05/25/19 172 lb 3.2 oz (78.1 kg)   Last Height:   Ht Readings from Last 1 Encounters:  05/25/19 4\' 10"  (1.473 m)     Physical exam: Exam: Gen: NAD, conversant, well nourised, obese, well groomed                     CV: RRR, no MRG. No Carotid Bruits. No peripheral edema, warm, nontender Eyes: Conjunctivae clear without exudates or hemorrhage  Neuro: Detailed Neurologic  Exam  Speech:    Speech is normal; fluent and spontaneous with normal comprehension.  Cognition:    The patient is oriented to person, place, and time;     recent and remote memory intact;     language fluent;     normal attention, concentration,     fund of knowledge Cranial Nerves:    The pupils are equal, round, and reactive to light. The fundi are normal and spontaneous venous pulsations are present. Visual fields are full to finger confrontation. Extraocular movements are intact. Trigeminal sensation is intact and the muscles of mastication are normal. The face is symmetric. The palate elevates in the midline. Hearing intact. Voice is normal. Shoulder shrug is normal. The tongue has normal  motion without fasciculations.   Coordination:    Normal finger to nose and heel to shin. Normal rapid alternating movements.   Gait:    Heel-toe and tandem gait are normal.   Motor Observation:    No asymmetry, no atrophy, and no involuntary movements noted. Tone:    Normal muscle tone.    Posture:    Posture is normal. normal erect    Strength:    Strength is V/V in the upper and lower limbs.      Sensation: intact to LT     Reflex Exam:  DTR's:    Deep tendon reflexes in the upper and lower extremities are normal bilaterally.   Toes:    The toes are downgoing bilaterally.   Clonus:    Clonus is absent.    Assessment/Plan:    No orders of the defined types were placed in this encounter.  No orders of the defined types were placed in this encounter.   Cc: Everrett Coombe, DO,  Everrett Coombe, DO  Naomie Dean, MD  [facility] [address] [place], [place] [address]  Phone [phone] Fax [phone]

## 2019-07-20 ENCOUNTER — Ambulatory Visit: Payer: Medicaid Other | Admitting: Neurology

## 2019-07-20 ENCOUNTER — Encounter: Payer: Self-pay | Admitting: Neurology

## 2019-08-09 ENCOUNTER — Other Ambulatory Visit: Payer: Self-pay | Admitting: Family Medicine

## 2019-08-09 ENCOUNTER — Other Ambulatory Visit: Payer: Self-pay

## 2019-08-09 MED ORDER — SUMATRIPTAN SUCCINATE 100 MG PO TABS
ORAL_TABLET | ORAL | 1 refills | Status: AC
Start: 2019-08-09 — End: ?

## 2019-08-09 MED ORDER — HYDROCHLOROTHIAZIDE 12.5 MG PO CAPS: 12.5000 mg | ORAL_CAPSULE | Freq: Every day | ORAL | 2 refills | Status: AC

## 2019-08-09 MED ORDER — METOPROLOL SUCCINATE ER 50 MG PO TB24: 50.0000 mg | ORAL_TABLET | Freq: Every day | ORAL | 3 refills | Status: AC

## 2019-08-19 ENCOUNTER — Other Ambulatory Visit: Payer: Self-pay

## 2019-08-19 ENCOUNTER — Emergency Department
Admission: EM | Admit: 2019-08-19 | Discharge: 2019-08-19 | Discharge status: Absent without leave | Payer: Self-pay | Source: Home / Self Care

## 2019-08-19 ENCOUNTER — Telehealth: Payer: Self-pay

## 2019-08-19 NOTE — Telephone Encounter (Signed)
She has had a lifetime of this, initially she declined physical therapy but now she will need it before we can consider MRI and intervention.  In the meantime please make sure she does not have any bowel or bladder dysfunction that is new, or saddle numbness, or rapidly progressive weakness/paralysis.

## 2019-08-19 NOTE — Telephone Encounter (Signed)
Pt called stating she was in immense back pain. She's been dealing with the pain for the past 2 days. Tried stretching but unable to find comfort.   Advised patient that a message would be sent to Dr. Karie Schwalbe; awaiting response and direction. If she is unable to wait for his response she can visit Urgent Care.

## 2019-08-20 ENCOUNTER — Ambulatory Visit: Payer: Self-pay | Admitting: Nurse Practitioner

## 2019-08-20 NOTE — Telephone Encounter (Signed)
Patient was seen and evaluated at Urgent Care

## 2020-05-10 LAB — HM MAMMOGRAPHY

## 2020-10-28 LAB — CBC WITH DIFFERENTIAL
Basophils %: 0.04 10*9/L (ref 0.00–0.1)
Basophils %: 0.5 % (ref 0.0–2.0)
Eosinophils %: 2.9 % (ref 0.0–6.0)
Eosinophils: 0.24 10*9/L (ref 0.00–0.7)
Hematocrit: 39 % (ref >=36.0)
Hemoglobin: 13.6 g/dL (ref >=12.0)
Immature Granulocytes %: 0.1 % (ref 0.0–0.9)
Lymphocytes %: 2.37 10*9/L (ref 1.20–4.8)
Lymphocytes: 28.3 % (ref >=13.0)
MCHC: 34.9 g/dL (ref >=32.0)
MCV: 94 fL (ref 80–100)
Monocytes %: 0.59 10*9/L (ref 0.10–1.0)
Monocytes %: 7 % (ref 2.0–10.0)
Neutrophils %: 61.2 % (ref >=40.0)
Neutrophils: 5.12 10*9/L (ref 1.20–7.7)
Platelets: 264 10*9/L (ref 150–450)
RBC: 4.15 10*12/L (ref 4.00–5.2)
RDW-CV: 12.4 % (ref >=11.5)
WBC: 8.4 10*9/L (ref 4.4–11.3)

## 2020-10-28 LAB — COMPREHENSIVE METABOLIC PANEL
ALT: 13 U/L (ref 7–45)
AST: 15 U/L (ref 9–39)
Albumin: 4 g/dL (ref 3.4–5.0)
Alkaline Phosphatase: 56 U/L (ref 33–110)
Anion Gap: 14 mmol/L (ref 10–20)
Calcium: 9 mg/dL (ref 8.6–10.3)
Chloride: 101 mmol/L (ref 98–107)
Creatinine: 0.7 mg/dL (ref 0.50–1.0)
Glucose: 91 mg/dL (ref 74–99)
HCO3: 26 mmol/L (ref 21–32)
Potassium: 3.5 mmol/L (ref 3.5–5.3)
Sodium: 137 mmol/L (ref 136–145)
Total Bilirubin: 0.7 mg/dL (ref 0.0–1.2)
Total Protein: 7.3 g/dL (ref 6.4–8.2)
Urea Nitrogen: 8 mg/dL (ref 6–23)
eGFR Female: >90 mL/min/{1.73_m2} (ref >90)

## 2020-10-28 LAB — COVID-19 & INFLUENZA COMBO
Influenza A by PCR: NOT DETECTED
Influenza B by PCR: NOT DETECTED
SARS-CoV-2, PCR: NOT DETECTED

## 2020-10-28 LAB — COAGULATION SCREEN
INR: 1 (ref 0.9–1.1)
Protime: 11.9 s (ref 9.8–13.4)
aPTT: 37 s (ref 26–39)

## 2020-10-28 LAB — BRAIN NATRIURETIC PEPTIDE: BNP: 12 pg/mL (ref 0–99)

## 2020-10-28 LAB — MAGNESIUM: Magnesium: 1.9 mg/dL (ref 1.60–2.4)

## 2020-10-28 LAB — TROPONIN I HIGH SENSITIVITY: Troponin I, High Sensitivity: <3 ng/L (ref 0–13)

## 2020-10-28 LAB — LIPASE: Lipase: 33 U/L (ref 9–82)

## 2021-05-05 LAB — COMPREHENSIVE METABOLIC PANEL
ALT: 14 U/L (ref 7–45)
AST: 15 U/L (ref 9–39)
Albumin: 4.5 g/dL (ref 3.4–5.0)
Alkaline Phosphatase: 68 U/L (ref 33–110)
Anion Gap: 12 mmol/L (ref 10–20)
Calcium: 9.2 mg/dL (ref 8.6–10.3)
Chloride: 102 mmol/L (ref 98–107)
Creatinine: 0.74 mg/dL (ref 0.50–1.05)
Glucose: 120 mg/dL — ABNORMAL HIGH (ref 74–99)
HCO3: 27 mmol/L (ref 21–32)
Potassium: 4.1 mmol/L (ref 3.5–5.3)
Sodium: 137 mmol/L (ref 136–145)
Total Bilirubin: 0.7 mg/dL (ref 0.0–1.2)
Total Protein: 7.8 g/dL (ref 6.4–8.2)
Urea Nitrogen: 8 mg/dL (ref 6–23)
eGFR Female: >90 mL/min/{1.73_m2} (ref >90)

## 2021-05-05 LAB — LIPASE: Lipase: 12 U/L (ref 9–82)

## 2021-05-05 LAB — CBC WITH DIFFERENTIAL
Basophils %: 0.05 10*9/L (ref 0.00–0.10)
Basophils %: 0.3 % (ref 0.0–2.0)
Eosinophils %: 0.8 % (ref 0.0–6.0)
Eosinophils: 0.14 10*9/L (ref 0.00–0.70)
Hematocrit: 43.1 % (ref 36.0–46.0)
Hemoglobin: 14.6 g/dL (ref 12.0–16.0)
Immature Granulocytes %: 0.4 % (ref 0.0–0.9)
Lymphocytes %: 2.6 10*9/L (ref 1.20–4.80)
Lymphocytes: 15.2 % (ref 13.0–44.0)
MCHC: 33.9 g/dL (ref 32.0–36.0)
MCV: 92 fL (ref 80–100)
Monocytes %: 0.84 10*9/L (ref 0.10–1.00)
Monocytes %: 4.9 % (ref 2.0–10.0)
Neutrophils %: 78.4 % (ref 40.0–80.0)
Neutrophils: 13.43 10*9/L — ABNORMAL HIGH (ref 1.20–7.70)
Nucleated RBCs: 0 /100 WBC (ref 0.0–0.0)
Platelets: 269 10*9/L (ref 150–450)
RBC: 4.71 10*12/L (ref 4.00–5.20)
RDW-CV: 12.8 % (ref 11.5–14.5)
WBC: 17.1 10*9/L — ABNORMAL HIGH (ref 4.4–11.3)

## 2021-05-05 LAB — URINALYSIS
Bilirubin, Urine: NEGATIVE
Glucose, Urine: NEGATIVE mg/dL
Ketones, Urine: NEGATIVE mg/dL
Leukocyte Esterase, Urine: NEGATIVE
Nitrite, Urine: NEGATIVE
Protein, UA: NEGATIVE mg/dL
Specific Gravity, Urine: 1.004 — ABNORMAL LOW (ref 1.005–1.035)
Urobilinogen, Urine: <2 mg/dL (ref 0.0–1.9)
pH, UA: 7 (ref 5.0–8.0)

## 2021-05-05 LAB — URINALYSIS WITH MICROSCOPIC: Squam Epithel, UA: <1 /HPF

## 2021-05-10 ENCOUNTER — Ambulatory Visit
Admit: 2021-05-10 | Discharge: 2021-05-10 | Payer: MEDICAID | Attending: Obstetrics & Gynecology | Primary: Family Medicine

## 2021-05-10 DIAGNOSIS — R102 Pelvic and perineal pain: Secondary | ICD-10-CM

## 2021-05-10 NOTE — Progress Notes (Signed)
HPI:  Rebecca Lamb (DOB: 11-Jun-1970) is a 51 y.o. female, New patient, here for evaluation of the following chief complaint(s):  Pelvic Pain (ER follow up. LMP was in her mid 87's, started bleeding very heavily 3 to 4 days with pain.)  This is a new patient who is 50 years old she has not been having any bleeding since she was in her mid 68s and then have had an episode of heavy bleeding for a week or 2 ago lasted 3 to 4 days.  She has a history of fibroids.  They did a CAT scan in the emergency room and that showed the fibroids as well.  Patient is here today for evaluation.      SUBJECTIVE/OBJECTIVE:    Past Surgical History:   Procedure Laterality Date    DENTAL SURGERY      THYROID SURGERY      06/09/2012    THYROID SURGERY      TUBAL LIGATION          Review of Systems   Constitutional:  Negative for activity change, appetite change, fatigue and unexpected weight change.   HENT:  Negative for dental problem, ear pain, hearing loss, nosebleeds, sore throat, trouble swallowing and voice change.    Eyes:  Negative for visual disturbance.   Respiratory:  Negative for cough, chest tightness, shortness of breath and wheezing.    Cardiovascular:  Negative for chest pain and palpitations.   Gastrointestinal:  Negative for abdominal distention, abdominal pain, blood in stool, constipation, nausea and vomiting.   Endocrine: Negative for cold intolerance, heat intolerance, polydipsia, polyphagia and polyuria.   Genitourinary:  Positive for menstrual problem and pelvic pain. Negative for difficulty urinating, dyspareunia, dysuria, flank pain, frequency, genital sores, hematuria, urgency, vaginal bleeding, vaginal discharge and vaginal pain.   Musculoskeletal:  Negative for arthralgias, back pain, joint swelling and myalgias.   Skin:  Negative for color change and rash.   Allergic/Immunologic: Negative for environmental allergies, food allergies and immunocompromised state.   Neurological:  Negative for dizziness, seizures,  syncope, speech difficulty, weakness, numbness and headaches.   Hematological:  Negative for adenopathy. Does not bruise/bleed easily.   Psychiatric/Behavioral:  Negative for agitation, behavioral problems, confusion, decreased concentration, dysphoric mood and suicidal ideas. The patient is not nervous/anxious and is not hyperactive.      Physical Exam  Vitals and nursing note reviewed. Exam conducted with a chaperone present.   Genitourinary:     General: Normal vulva.             Vitals:    05/10/21 1354   BP: 122/74   Pulse: 84   Weight: 182 lb (82.6 kg)         ASSESSMENT/PLAN:     Diagnosis Orders   1. Pelvic pain  [place] DUP ABD PEL RETRO SCROT COMPLETE    [place] NON OB TRANSVAGINAL    US PELVIS COMPLETE      Uterine fibroids  Menopausal bleeding  PLAN: We will get a pelvic ultrasound to evaluate the uterine lining and the size and location of the fibroids.  She may need a Endo C and an endometrial biopsy      No follow-ups on file.      On this date 05/10/2021 I have spent 30 minutes reviewing previous notes, test results and face to face with the patient discussing the diagnosis and importance of compliance with the treatment plan as well as documenting on the day of the visit.  An electronic signature was used to authenticate this note. Please note this report has been partially produced using speech recognition software and may cause contain errors related to that system including grammar, punctuation and spelling as well as words and phrases that may seem inappropriate. If there are questions or concerns please feel free to contact me to clarify.

## 2021-05-17 ENCOUNTER — Inpatient Hospital Stay: Admit: 2021-05-17 | Payer: MEDICAID | Primary: Family Medicine

## 2021-05-17 DIAGNOSIS — R102 Pelvic and perineal pain: Secondary | ICD-10-CM

## 2021-05-17 DIAGNOSIS — D259 Leiomyoma of uterus, unspecified: Secondary | ICD-10-CM

## 2021-05-29 ENCOUNTER — Telehealth

## 2021-05-29 NOTE — Telephone Encounter (Signed)
Pt called in for results of [place]. Pt was informed and set up for procedure, needs endosee meds sent to pharmacy.     Worthy Rancher Salli Real

## 2021-05-31 MED ORDER — IBUPROFEN 600 MG PO TABS
600 MG | ORAL_TABLET | Freq: Once | ORAL | 0 refills | Status: AC
Start: 2021-05-31 — End: 2021-05-31

## 2021-05-31 MED ORDER — DIAZEPAM 5 MG PO TABS
5 MG | ORAL_TABLET | Freq: Once | ORAL | 0 refills | Status: AC
Start: 2021-05-31 — End: 2021-05-31

## 2021-05-31 MED ORDER — MISOPROSTOL 200 MCG PO TABS
200 MCG | ORAL_TABLET | ORAL | 0 refills | Status: DC
Start: 2021-05-31 — End: 2021-07-03

## 2021-06-07 ENCOUNTER — Encounter

## 2021-06-07 ENCOUNTER — Ambulatory Visit
Admit: 2021-06-07 | Discharge: 2021-06-07 | Payer: MEDICAID | Attending: Obstetrics & Gynecology | Primary: Family Medicine

## 2021-06-07 DIAGNOSIS — N950 Postmenopausal bleeding: Secondary | ICD-10-CM

## 2021-06-07 NOTE — Progress Notes (Signed)
HPI:  Rebecca Lamb (DOB: 04-Dec-1970) is a 51 y.o. female, Established patient, here for evaluation of the following chief complaint(s):  Procedure  Is here for A&O centimeters and endometrial biopsy for postmenopausal bleeding.  She did do her Cytotec prep.  She continues to have some light bleeding.      SUBJECTIVE/OBJECTIVE:    Past Surgical History:   Procedure Laterality Date    DENTAL SURGERY      THYROID SURGERY      06/09/2012    THYROID SURGERY      TUBAL LIGATION          Review of Systems   Constitutional:  Negative for activity change, appetite change, fatigue and unexpected weight change.   HENT:  Negative for dental problem, ear pain, hearing loss, nosebleeds, sore throat, trouble swallowing and voice change.    Eyes:  Negative for visual disturbance.   Respiratory:  Negative for cough, chest tightness, shortness of breath and wheezing.    Cardiovascular:  Negative for chest pain and palpitations.   Gastrointestinal:  Negative for abdominal distention, abdominal pain, blood in stool, constipation, nausea and vomiting.   Endocrine: Negative for cold intolerance, heat intolerance, polydipsia, polyphagia and polyuria.   Genitourinary:  Positive for vaginal bleeding. Negative for difficulty urinating, dyspareunia, dysuria, flank pain, frequency, genital sores, hematuria, menstrual problem, pelvic pain, urgency, vaginal discharge and vaginal pain.   Musculoskeletal:  Negative for arthralgias, back pain, joint swelling and myalgias.   Skin:  Negative for color change and rash.   Allergic/Immunologic: Negative for environmental allergies, food allergies and immunocompromised state.   Neurological:  Negative for dizziness, seizures, syncope, speech difficulty, weakness, numbness and headaches.   Hematological:  Negative for adenopathy. Does not bruise/bleed easily.   Psychiatric/Behavioral:  Negative for agitation, behavioral problems, confusion, decreased concentration, dysphoric mood and suicidal ideas. The  patient is not nervous/anxious and is not hyperactive.      Physical Exam  Vitals and nursing note reviewed. Exam conducted with a chaperone present.   Constitutional:       Appearance: She is well-developed.   HENT:      Head: Normocephalic and atraumatic.   Eyes:      Pupils: Pupils are equal, round, and reactive to light.   Neck:      Thyroid: No thyromegaly.      Trachea: No tracheal deviation.   Cardiovascular:      Rate and Rhythm: Normal rate and regular rhythm.      Heart sounds: Normal heart sounds.   Pulmonary:      Effort: Pulmonary effort is normal.      Breath sounds: Normal breath sounds.   Chest:      Chest wall: No tenderness.   Abdominal:      General: Bowel sounds are normal. There is no distension.      Palpations: Abdomen is soft. There is no mass.      Tenderness: There is no abdominal tenderness. There is no guarding or rebound.      Hernia: There is no hernia in the left inguinal area.   Genitourinary:     General: Normal vulva.      Labia:         Right: No rash, tenderness, lesion or injury.         Left: No rash, tenderness, lesion or injury.       Vagina: Normal. No foreign body. No vaginal discharge, erythema, tenderness or bleeding.  Cervix: No cervical motion tenderness or discharge.      Uterus: Not deviated, not enlarged, not fixed and not tender.       Adnexa:         Right: No mass, tenderness or fullness.          Left: No mass, tenderness or fullness.        Rectum: Normal. No mass, tenderness, anal fissure, external hemorrhoid or internal hemorrhoid. Normal anal tone.         Musculoskeletal:         General: Normal range of motion.      Cervical back: Normal range of motion.   Lymphadenopathy:      Cervical: No cervical adenopathy.   Neurological:      Mental Status: She is alert and oriented to person, place, and time.       Vitals:    06/07/21 1511   BP: 104/70   Pulse: (!) 101   Weight: 183 lb (83 kg)         ASSESSMENT/PLAN:     Diagnosis Orders   1. Postmenopausal  bleeding        2. Fibroids, subserous            PLAN: Unsuccessful attempt at an endometrial biopsy and a Endo see procedure  Endocervical tissue most likely obtained and was sent for pathology.  Depending on that result patient will most likely need a hysteroscopy and a D&C under general anesthesia we will call the patient with those results      No follow-ups on file.      On this date 06/07/2021 I have spent 20 minutes reviewing previous notes, test results and face to face with the patient discussing the diagnosis and importance of compliance with the treatment plan as well as documenting on the day of the visit.      An electronic signature was used to authenticate this note. Please note this report has been partially produced using speech recognition software and may cause contain errors related to that system including grammar, punctuation and spelling as well as words and phrases that may seem inappropriate. If there are questions or concerns please feel free to contact me to clarify.

## 2021-06-11 NOTE — Telephone Encounter (Signed)
Patient called to go over pathology results that were released to [facility]

## 2021-06-13 NOTE — Telephone Encounter (Signed)
Patient informed and appt scheduled to discuss surgery and sign forms

## 2021-06-15 NOTE — Telephone Encounter (Signed)
Patient states that she has decided to go through with the surgical procedure that was discussed at hr LOV ablation and the hysterosalpingectomy procedure

## 2021-06-19 ENCOUNTER — Ambulatory Visit
Admit: 2021-06-19 | Discharge: 2021-06-19 | Payer: MEDICAID | Attending: Obstetrics & Gynecology | Primary: Family Medicine

## 2021-06-19 DIAGNOSIS — D252 Subserosal leiomyoma of uterus: Secondary | ICD-10-CM

## 2021-06-19 NOTE — Progress Notes (Signed)
HPI:  Rebecca Lamb (DOB: 31-May-1970) is a 51 y.o. female, Established patient, here for evaluation of the following chief complaint(s):  Surgical Consult (Discuss ablation hystersalp)  The patient's endometrial biopsy showed only lower uterine segment tissue and an adequate endometrial biopsy was not obtained.      SUBJECTIVE/OBJECTIVE:    Past Surgical History:   Procedure Laterality Date    DENTAL SURGERY      THYROID SURGERY      06/09/2012    THYROID SURGERY      TUBAL LIGATION          Review of Systems   Constitutional:  Negative for activity change, appetite change, fatigue and unexpected weight change.   HENT:  Negative for dental problem, ear pain, hearing loss, nosebleeds, sore throat, trouble swallowing and voice change.    Eyes:  Negative for visual disturbance.   Respiratory:  Negative for cough, chest tightness, shortness of breath and wheezing.    Cardiovascular:  Negative for chest pain and palpitations.   Gastrointestinal:  Negative for abdominal distention, abdominal pain, blood in stool, constipation, nausea and vomiting.   Endocrine: Negative for cold intolerance, heat intolerance, polydipsia, polyphagia and polyuria.   Genitourinary:  Positive for menstrual problem and vaginal bleeding. Negative for difficulty urinating, dyspareunia, dysuria, flank pain, frequency, genital sores, hematuria, pelvic pain, urgency, vaginal discharge and vaginal pain.   Musculoskeletal:  Negative for arthralgias, back pain, joint swelling and myalgias.   Skin:  Negative for color change and rash.   Allergic/Immunologic: Negative for environmental allergies, food allergies and immunocompromised state.   Neurological:  Negative for dizziness, seizures, syncope, speech difficulty, weakness, numbness and headaches.   Hematological:  Negative for adenopathy. Does not bruise/bleed easily.   Psychiatric/Behavioral:  Negative for agitation, behavioral problems, confusion, decreased concentration, dysphoric mood and suicidal  ideas. The patient is not nervous/anxious and is not hyperactive.      Physical Exam  Vitals and nursing note reviewed.   Constitutional:       Appearance: Normal appearance.   Neurological:      Mental Status: She is alert.       Vitals:    06/19/21 0905   BP: 116/74   Pulse: 77   Weight: 186 lb (84.4 kg)         ASSESSMENT/PLAN:    Postmenopausal bleeding  Uterine fibroids  Inadequate endometrial sampling at endometrial biopsy    PLAN: Hysteroscopy fractional D&C      No follow-ups on file.      On this date 06/19/2021 I have spent 20 minutes reviewing previous notes, test results and face to face with the patient discussing the diagnosis and importance of compliance with the treatment plan as well as documenting on the day of the visit.      An electronic signature was used to authenticate this note. Please note this report has been partially produced using speech recognition software and may cause contain errors related to that system including grammar, punctuation and spelling as well as words and phrases that may seem inappropriate. If there are questions or concerns please feel free to contact me to clarify.

## 2021-06-19 NOTE — Addendum Note (Signed)
Addended by: Tarri Abernethy on: 06/19/2021 09:45 AM     Modules accepted: Orders

## 2021-06-20 NOTE — Telephone Encounter (Signed)
Pt scheduled for surgery

## 2021-06-20 NOTE — Addendum Note (Signed)
Addended by: Baxter Hire on: 06/20/2021 08:36 AM     Modules accepted: Orders

## 2021-06-27 ENCOUNTER — Inpatient Hospital Stay: Admit: 2021-06-27 | Payer: MEDICAID | Primary: Family Medicine

## 2021-06-27 DIAGNOSIS — Z01812 Encounter for preprocedural laboratory examination: Secondary | ICD-10-CM

## 2021-06-27 DIAGNOSIS — D259 Leiomyoma of uterus, unspecified: Secondary | ICD-10-CM

## 2021-06-27 LAB — APTT: aPTT: 29.9 s (ref 24.4–36.8)

## 2021-06-27 LAB — TYPE AND SCREEN
ABO/Rh: O POS
Antibody Screen: NEGATIVE

## 2021-06-27 LAB — URINALYSIS
Bilirubin Urine: NEGATIVE
Blood, Urine: NEGATIVE
Glucose, Ur: NEGATIVE mg/dL
Ketones, Urine: NEGATIVE mg/dL
Leukocyte Esterase, Urine: NEGATIVE
Nitrite, Urine: NEGATIVE
Protein, UA: NEGATIVE mg/dL
Specific Gravity, UA: 1.017 (ref 1.005–1.030)
Urobilinogen, Urine: 0.2 E.U./dL (ref <2.0)
pH, UA: 5.5 (ref 5.0–9.0)

## 2021-06-27 NOTE — Telephone Encounter (Signed)
Meghan from [employer], pt will require medical clearance . Set up with Dr.Holiday 07-03-21, surgery moved to 07-16-21.     Called pt made aware of changes.

## 2021-06-27 NOTE — H&P (Signed)
Preoperative Consultation      Name: Rebecca Lamb  Date of Birth: 23-Nov-1970  Date of Service: 06/27/2021  PCP: Oleh Genin, MD    CHIEF COMPLAINT:  postmenopausal bleeding, uterine fibroid    HISTORY OF PRESENT ILLNESS:      The patient is a 51 y.o. female with significant past medical history of  postmenopausal bleeding, uterine fibroid, rheumatoid arthritis, depression, anxiety, migraines, thyroid disease, HPL, HTN, asthma who presents for a preoperative consultation at the request of surgeon, Dr. Ozella Rocks, who plans on performing hysteroscopy with fraction dilation and curettage on 07/02/21 at [facility].     Pt reports her LMP was in her 57's. She reports in April 2023 she started bleeding very heavily for a few days. She reports she felt week and called her PCP who told her to go to the ER. She reports she went to the ER and they did a CT scan which showed fibroids. Pt reports she was referred to [facility] She reports she saw Dr. Ozella Rocks in May and had a pelvic ultrasound done. She also reports she had an endometrial biopsy done. She reports since the original episode of bleeding in April she had one more episode 2 weeks ago. She reports the recent episode of bleeding was not as heavy and lasted about 1 full day. She denies any current bleeding. She denies any pain, pelvic pain, or vaginal pain. She reports she has hx of fibroids. She denies any urologic symptoms.     Pt reports during April ER admission for bleeding she was also having chest pain. She reports they did an EKG. She reports she saw her PCP recently on June 19th and she reports he wanted her to see cardiology in the future-no referral made that I can see via epic. Pt reports she does not have any appointment with cardiology. Pt takes metoprolol '25mg'$  twice daily. She reports she used to follow with cardiology when she lived in [place] but has not seen cardiology since living in [place] the past year. Pt is unsure if she has ever had any prior  cardiac testing/stress test but says she thinks she was told in [place] she had something wrong with one of her heart valves. She reports she has had intermittent chest pain since April ER visit. She reports she gets chest tightness from her asthma so she is unsure if it chest pain or her asthma. She reports she has new shortness of breath over the past 1 month and feels she cant walk far distances without trouble breathing. She also reports intermittent leg swelling. Pt currently denies any chest pain, shortness of breath, palpitations, cough, chest tightness, lightheadedness, dizziness. Pt does have bilateral lower extremity leg swelling. EKG ordered today. I told pt she needs cardiac clearance for surgery and pt verbalizes understanding. I made apt with [facility] cardiology for pt on 07/03/21 at 8:30am. I called Dr. Tawni Millers office (spoke with Elmyra Ricks) to make them aware of pt needing cardiac clearance and need to move surgery. Pt is aware of cardiology apt and the need to move surgery date until cardiac clearance is done. Pt verbalizes understanding. Dr. Tawni Millers office will reach out to pt to reschedule. Pt educated that if she has new chest pain or worsening chest pain, chest tightness, palpitations, shortness of breath, lightheadedness, dizziness, nausea she needs to seek further care and go to ER-pt verbalizes understanding.     Smoking hx: non smoker     Known Anesthesia Problems: None  Bleeding Risk:  No recent or remote history of abnormal bleeding  Patient Objection to Receiving Blood Products: No  Personal of FH of DVT/PE: No    Medical/Cardiac Clearance: Yes - see above. Pt needs cardiac clearance. Pt has apt with [facility] cardiology on 07/03/21 at 8:30am.     Past Medical History:        Diagnosis Date    Acute right-sided low back pain without sciatica 04/24/2021    Arthritis     Asthma     Back pain     Headache(784.0)     Hyperlipemia 04/24/2021    Hypertension     Hypothyroidism, postsurgical  03/27/2021    Migraine     Mild episode of recurrent major depressive disorder ([place]) 04/24/2021    Seasonal allergic rhinitis 04/24/2021     Past Surgical History:    Past Surgical History:   Procedure Laterality Date    COLONOSCOPY      DENTAL SURGERY      THYROID SURGERY      06/09/2012    THYROID SURGERY      TUBAL LIGATION         Allergies:    Peanut oil, Dog epithelium allergy skin test, Grass pollen([id number] vern), Ibuprofen, and Pollen extract    Medications Prior to Admission:    Current Outpatient Medications   Medication Sig Dispense Refill    levothyroxine (SYNTHROID) 200 MCG tablet Take 1 tablet by mouth daily      citalopram (CELEXA) 10 MG tablet Take 1 tablet by mouth daily      atorvastatin (LIPITOR) 10 MG tablet Take 1 tablet by mouth daily      miSOPROStol (CYTOTEC) 200 MCG tablet Take one tab at bedtime and one in the morning of procedure (Patient not taking: Reported on 06/27/2021) 2 tablet 0    metoprolol tartrate (LOPRESSOR) 25 MG tablet Take 1 tablet by mouth 2 times daily      SUMAtriptan (IMITREX) 100 MG tablet TAKE 1 TABLET BY MOUTH 1 TIME IF NEEDED FOR MIGRAINE. MAY REPEAT AFTER 2 HOURS.      montelukast (SINGULAIR) 10 MG tablet Take 1 tablet by mouth every evening (Patient not taking: Reported on 06/27/2021)      VENTOLIN HFA 108 (90 BASE) MCG/ACT inhaler   6    albuterol (PROVENTIL) (2.5 MG/3ML) 0.083% nebulizer solution Take 3 mLs by nebulization every 6 hours as needed for Wheezing or Shortness of Breath       No current facility-administered medications for this encounter.       Social History:   Social History     Socioeconomic History    Marital status: Divorced     Spouse name: Not on file    Number of children: Not on file    Years of education: Not on file    Highest education level: Not on file   Occupational History    Not on file   Tobacco Use    Smoking status: Never    Smokeless tobacco: Never   Vaping Use    Vaping Use: Never used   Substance and Sexual Activity    Alcohol  use: Not Currently     Comment: ocassionally    Drug use: No    Sexual activity: Not Currently     Partners: Male   Other Topics Concern    Not on file   Social History Narrative    ** Merged History Encounter **  Social Determinants of Health     Financial Resource Strain: Low Risk     Difficulty of Paying Living Expenses: Not hard at all   Food Insecurity: Food Insecurity Present    Worried About Charity fundraiser in the Last Year: Often true    Arboriculturist in the Last Year: Often true   Transportation Needs: Unknown    Film/video editor (Medical): Not on file    Lack of Transportation (Non-Medical): No   Physical Activity: Not on file   Stress: Not on file   Social Connections: Not on file   Intimate Partner Violence: Not on file   Housing Stability: Unknown    Unable to Pay for Housing in the Last Year: Not on file    Number of Places Lived in the Last Year: Not on file    Unstable Housing in the Last Year: No       Family History:       Problem Relation Age of Onset    Depression Mother     Diabetes Mother     High Cholesterol Mother     Asthma Mother     Cancer Father         brain and lung cancer    Diabetes Father     Cancer Sister     Diabetes Brother     Diabetes Brother     Breast Cancer Maternal Aunt     Breast Cancer Paternal Aunt     Other Son         disabled    Asthma Daughter     Rheum Arthritis Daughter     Cervical Cancer Neg Hx     Ovarian Cancer Neg Hx     Uterine Cancer Neg Hx        Review of Systems   Constitutional:  Positive for appetite change (decreased over the past month). Negative for chills, diaphoresis, fatigue, fever and unexpected weight change.   HENT:  Negative for congestion, ear discharge, ear pain, hearing loss, mouth sores, nosebleeds, postnasal drip, rhinorrhea, sinus pressure, sinus pain, sneezing, sore throat, tinnitus and trouble swallowing.    Eyes:  Negative for photophobia, pain, discharge, redness, itching and visual disturbance.   Respiratory:   Negative for cough, chest tightness, shortness of breath and wheezing.         Hx asthma (uses inhaler)   Cardiovascular:  Negative for chest pain, palpitations and leg swelling.        Hx chest pain   Gastrointestinal:  Negative for abdominal distention, abdominal pain, blood in stool, constipation, diarrhea, nausea and vomiting.        "Bloated" feeling   Endocrine: Negative for cold intolerance and heat intolerance.   Genitourinary:  Negative for difficulty urinating, dysuria, frequency, hematuria and urgency.   Musculoskeletal:  Positive for arthralgias (Hx RA), back pain (chronic) and gait problem (No assistive devices, no falls). Negative for myalgias, neck pain and neck stiffness.   Skin:  Negative for rash and wound.   Allergic/Immunologic:        Seasonal allergies  Hx RA   Neurological:  Positive for headaches (hx migraines). Negative for dizziness, tremors, seizures, syncope, weakness, light-headedness and numbness.   Hematological: Negative.    Psychiatric/Behavioral:          Hx depression and anxiety      Vitals:  BP 126/74   Pulse 72   Temp 97.6 F (36.4 C) (Temporal)   Resp  16   Ht 4' 10.75" (1.492 m)   Wt 183 lb (83 kg)   SpO2 98%   BMI 37.28 kg/m       Physical Exam  Constitutional:       General: She is not in acute distress.     Appearance: Normal appearance. She is not ill-appearing, toxic-appearing or diaphoretic.   HENT:      Head: Normocephalic and atraumatic.      Right Ear: Tympanic membrane, ear canal and external ear normal. There is no impacted cerumen.      Left Ear: Tympanic membrane, ear canal and external ear normal. There is no impacted cerumen.      Nose: Nose normal. No congestion or rhinorrhea.      Mouth/Throat:      Mouth: Mucous membranes are moist.      Pharynx: Oropharynx is clear. No oropharyngeal exudate or posterior oropharyngeal erythema.   Eyes:      General: No scleral icterus.        Right eye: No discharge.         Left eye: No discharge.      Extraocular  Movements: Extraocular movements intact.      Conjunctiva/sclera: Conjunctivae normal.      Pupils: Pupils are equal, round, and reactive to light.   Cardiovascular:      Rate and Rhythm: Normal rate and regular rhythm.      Pulses: Normal pulses.      Heart sounds: Normal heart sounds. No murmur heard.  Pulmonary:      Effort: Pulmonary effort is normal. No respiratory distress.      Breath sounds: Normal breath sounds. No wheezing.   Abdominal:      General: Bowel sounds are normal. There is no distension.      Palpations: Abdomen is soft.      Tenderness: There is no abdominal tenderness. There is no guarding.   Genitourinary:     Comments: Deferred to Dr. Ozella Rocks  Musculoskeletal:         General: No swelling. Normal range of motion.      Cervical back: Normal range of motion. No rigidity.      Right lower leg: Edema (non pitting) present.      Left lower leg: Edema (non pitting) present.   Lymphadenopathy:      Cervical: No cervical adenopathy.   Skin:     General: Skin is warm and dry.      Capillary Refill: Capillary refill takes less than 2 seconds.      Coloration: Skin is not jaundiced.      Findings: No bruising, erythema or rash.   Neurological:      General: No focal deficit present.      Mental Status: She is alert and oriented to person, place, and time.      Gait: Gait normal.   Psychiatric:         Mood and Affect: Mood normal.         Behavior: Behavior normal.         Thought Content: Thought content normal.         Judgment: Judgment normal.       Assessment:  51 y.o. patient with   Patient Active Problem List   Diagnosis    Weight gain    Seasonal allergic rhinitis    Rheumatoid arthritis (HCC)    Rash    Muscle strain    Mild episode of recurrent major  depressive disorder (HCC)    Migraine    Lower resp. tract infection    Hypothyroidism, postsurgical    Hypocalcemia    Hyperlipemia    Graves' disease    Fatigue    Essential hypertension    Chest pain, atypical    Asthma    Acute right-sided  low back pain without sciatica    Backache    Abnormal stress test    PMB (postmenopausal bleeding)    Uterine fibroid      with planned surgery as above.    Plan:  Preoperative workup as follows: PAT, PTT, T&S, urinalysis, EKG, CBC w/diff 06/26/21 in epic, CMP 06/26/21 in epic   2.   Scheduled for: hysteroscopy with fraction dilation and curettage on 07/02/21  3. Cardiac Clearance needed-pt has apt with [facility] cardiology on 07/03/21 at 8:30am      Joyce Gross, APRN - CNP  06/27/2021  12:38 PM

## 2021-06-28 LAB — EKG 12-LEAD
Atrial Rate: 67 {beats}/min
P Axis: 28 degrees
P-R Interval: 162 ms
Q-T Interval: 412 ms
QRS Duration: 78 ms
QTc Calculation (Bazett): 435 ms
R Axis: -22 degrees
T Axis: 5 degrees
Ventricular Rate: 67 {beats}/min

## 2021-07-03 ENCOUNTER — Ambulatory Visit: Admit: 2021-07-03 | Discharge: 2021-07-03 | Payer: MEDICAID | Attending: Internal Medicine | Primary: Family Medicine

## 2021-07-03 DIAGNOSIS — I10 Essential (primary) hypertension: Secondary | ICD-10-CM

## 2021-07-03 NOTE — Progress Notes (Signed)
Chief Complaint   Patient presents with    Establish Cardiologist     PAT REFERRING    Cardiac Clearance     HYSTEROSCOPY DR MATHESON 07/16/21       07-03-21: Patient presents for initial medical evaluation. Patient is followed on a regular basis by Dr. Oleh Genin, MD. patient presents for preoperative clearance for D&C procedure.  EKG was performed showing a possible anterior septal myocardial infarction.  Set patient status post normal nuclear stress test in September 2022 with ejection fraction of 65%.  There was a fixed small sized defect in the apex which was thought to be apical thinning versus prior infarction.  It was a low risk stress test.  She denies any angina or heart failure type symptoms.  No prior history of myocardial infarction congestive heart failure or arrhythmia.  Positive history of hypertension hyperlipidemia.  No diabetes no smoking.  Lipid profile is abnormal          Patient Active Problem List   Diagnosis    Weight gain    Seasonal allergic rhinitis    Rheumatoid arthritis ([place])    Rash    Muscle strain    Mild episode of recurrent major depressive disorder (HCC)    Migraine    Lower resp. tract infection    Hypothyroidism, postsurgical    Hypocalcemia    Hyperlipemia    Graves' disease    Fatigue    Essential hypertension    Asthma    Acute right-sided low back pain without sciatica    Backache    PMB (postmenopausal bleeding)    Uterine fibroid    Pre-operative clearance    Obesity, Class II, BMI 35-39.9       Past Surgical History:   Procedure Laterality Date    COLONOSCOPY      DENTAL SURGERY      THYROID SURGERY      06/09/2012    THYROID SURGERY      TUBAL LIGATION         Social History     Socioeconomic History    Marital status: Divorced   Tobacco Use    Smoking status: Never    Smokeless tobacco: Never   Vaping Use    Vaping Use: Never used   Substance and Sexual Activity    Alcohol use: Not Currently     Comment: ocassionally    Drug use: No    Sexual activity: Not Currently      Partners: Male   Social History Narrative    ** Merged History Encounter **          Social Determinants of Health     Financial Resource Strain: Low Risk     Difficulty of Paying Living Expenses: Not hard at all   Food Insecurity: Food Insecurity Present    Worried About Charity fundraiser in the Last Year: Often true    Arboriculturist in the Last Year: Often true   Transportation Needs: Unknown    Lack of Transportation (Non-Medical): No   Housing Stability: Unknown    Unstable Housing in the Last Year: No       Family History   Problem Relation Age of Onset    Depression Mother     Diabetes Mother     High Cholesterol Mother     Asthma Mother     Cancer Father         brain and lung cancer  Diabetes Father     Cancer Sister     Diabetes Brother     Diabetes Brother     Breast Cancer Maternal Aunt     Breast Cancer Paternal Aunt     Other Son         disabled    Asthma Daughter     Rheum Arthritis Daughter     Cervical Cancer Neg Hx     Ovarian Cancer Neg Hx     Uterine Cancer Neg Hx        Current Outpatient Medications   Medication Sig Dispense Refill    levothyroxine (SYNTHROID) 200 MCG tablet Take 1 tablet by mouth daily      citalopram (CELEXA) 10 MG tablet Take 1 tablet by mouth daily      atorvastatin (LIPITOR) 10 MG tablet Take 1 tablet by mouth daily      metoprolol tartrate (LOPRESSOR) 25 MG tablet Take 1 tablet by mouth 2 times daily      SUMAtriptan (IMITREX) 100 MG tablet TAKE 1 TABLET BY MOUTH 1 TIME IF NEEDED FOR MIGRAINE. MAY REPEAT AFTER 2 HOURS.      VENTOLIN HFA 108 (90 BASE) MCG/ACT inhaler   6    albuterol (PROVENTIL) (2.5 MG/3ML) 0.083% nebulizer solution Take 3 mLs by nebulization every 6 hours as needed for Wheezing or Shortness of Breath       No current facility-administered medications for this visit.       Peanut oil, Dog epithelium allergy skin test, Grass pollen(k-o-r-t-swt vern), Ibuprofen, and Pollen extract    Review of Systems:  General ROS: negative  Psychological ROS:  negative  Hematological and Lymphatic ROS: No history of blood clots or bleeding disorder.   Respiratory ROS: no cough, shortness of breath, or wheezing  Cardiovascular ROS: no chest pain or dyspnea on exertion  Gastrointestinal ROS: no abdominal pain, change in bowel habits, or black or bloody stools  Genito-Urinary ROS: no dysuria, trouble voiding, or hematuria  Musculoskeletal ROS: negative  Neurological ROS: no TIA or stroke symptoms  Dermatological ROS: negative    VITALS:  Blood pressure 130/80, pulse 82, weight 186 lb 9.6 oz (84.6 kg), last menstrual period 01/08/2004, SpO2 98 %.  Body mass index is 38.01 kg/m.    Physical Examination:  General appearance - alert, well appearing, and in no distress  Mental status - alert, oriented to person, place, and time  Neck - Neck is supple, no JVD or carotid bruits.  No thyromegaly or adenopathy.   Chest - clear to auscultation, no wheezes, rales or rhonchi, symmetric air entry  Heart - normal rate, regular rhythm, normal S1, S2, no murmurs, rubs, clicks or gallops  Abdomen - soft, nontender, nondistended, no masses or organomegaly  Neurological - alert, oriented, normal speech, no focal findings or movement disorder noted  Extremities - peripheral pulses normal, no pedal edema, no clubbing or cyanosis  Skin - normal coloration and turgor, no rashes, no suspicious skin lesions noted      EKG: normal sinus rhythm, nonspecific ST and T waves changes    No orders of the defined types were placed in this encounter.      ASSESSMENT:     Diagnosis Orders   1. Essential hypertension        2. Mixed hyperlipidemia        3. Pre-operative clearance        4. Obesity, Class II, BMI 35-39.9  PLAN:       As always, aggressive risk factor modification is strongly recommended. We should adhere to the JNC VIII guidelines for HTN management and the NCEP ATP III guidelines for LDL-C management.    Cardiac diet is always recommended with low fat, cholesterol, calories and  sodium.    Continue medications at current doses.     Patient is a low to intermediate risk patient for the proposed procedure/surgery. No active cardiac conditions such as ischemia, CHF or arrhythmias. Recommend BBlocker, GI/DVT prophylaxis.    Lipitor 10 mg daily.    Check ECHO in future given HTN    Patient was advised and encouraged to check blood pressure at home or at a pharmacy, maintain a logbook, and also call us back if blood pressure are above the target ranges or if it is low. Patient clearly understands and agrees to the instructions.     We will need to continue to monitor muscle and liver enzymes, BUN, CR, and electrolytes.

## 2021-07-12 ENCOUNTER — Encounter: Payer: MEDICAID | Attending: Obstetrics & Gynecology | Primary: Family Medicine

## 2021-07-16 ENCOUNTER — Inpatient Hospital Stay: Payer: Medicaid (Managed Care) | Attending: Obstetrics & Gynecology

## 2021-07-16 MED ORDER — SODIUM CHLORIDE 0.9 % IV SOLN
0.9 % | INTRAVENOUS | Status: DC | PRN
Start: 2021-07-16 — End: 2021-07-16

## 2021-07-16 MED ORDER — CEFAZOLIN SODIUM 2 G SOLR (MIXTURES ONLY)
2 g | INTRAVENOUS | Status: AC
Start: 2021-07-16 — End: 2021-07-16
  Administered 2021-07-16: 18:00:00 2000 mg via INTRAVENOUS

## 2021-07-16 MED ORDER — FENTANYL CITRATE (PF) 50 MCG/ML IJ SOLN
50 MCG/ML | INTRAMUSCULAR | Status: DC | PRN
Start: 2021-07-16 — End: 2021-07-16

## 2021-07-16 MED ORDER — NORMAL SALINE FLUSH 0.9 % IV SOLN
0.9 % | INTRAVENOUS | Status: DC | PRN
Start: 2021-07-16 — End: 2021-07-16

## 2021-07-16 MED ORDER — ONDANSETRON HCL 4 MG/2ML IJ SOLN
4 MG/2ML | Freq: Once | INTRAMUSCULAR | Status: AC | PRN
Start: 2021-07-16 — End: 2021-07-16
  Administered 2021-07-16: 19:00:00 4 mg via INTRAVENOUS

## 2021-07-16 MED ORDER — PROPOFOL 200 MG/20ML IV EMUL
200 MG/20ML | INTRAVENOUS | Status: AC
Start: 2021-07-16 — End: ?

## 2021-07-16 MED ORDER — MEPERIDINE HCL 25 MG/ML IJ SOLN
25 MG/ML | Freq: Once | INTRAMUSCULAR | Status: DC | PRN
Start: 2021-07-16 — End: 2021-07-16

## 2021-07-16 MED ORDER — NORMAL SALINE FLUSH 0.9 % IV SOLN
0.9 % | Freq: Two times a day (BID) | INTRAVENOUS | Status: DC
Start: 2021-07-16 — End: 2021-07-16

## 2021-07-16 MED ORDER — PROPOFOL 200 MG/20ML IV EMUL
200 MG/20ML | INTRAVENOUS | Status: DC | PRN
Start: 2021-07-16 — End: 2021-07-16
  Administered 2021-07-16: 18:00:00 100 via INTRAVENOUS
  Administered 2021-07-16: 18:00:00 200 via INTRAVENOUS

## 2021-07-16 MED ORDER — LACTATED RINGERS IV SOLN
INTRAVENOUS | Status: AC
Start: 2021-07-16 — End: 2021-07-16
  Administered 2021-07-16: 17:00:00 via INTRAVENOUS

## 2021-07-16 MED ORDER — DEXAMETHASONE SODIUM PHOSPHATE 4 MG/ML IJ SOLN
4 MG/ML | INTRAMUSCULAR | Status: AC
Start: 2021-07-16 — End: ?

## 2021-07-16 MED ORDER — KETOROLAC TROMETHAMINE 30 MG/ML IJ SOLN
30 MG/ML | INTRAMUSCULAR | Status: DC | PRN
Start: 2021-07-16 — End: 2021-07-16
  Administered 2021-07-16: 18:00:00 30 via INTRAVENOUS

## 2021-07-16 MED ORDER — MIDAZOLAM HCL 2 MG/2ML IJ SOLN
2 MG/ML | INTRAMUSCULAR | Status: DC | PRN
Start: 2021-07-16 — End: 2021-07-16
  Administered 2021-07-16: 18:00:00 2 via INTRAVENOUS

## 2021-07-16 MED ORDER — FENTANYL CITRATE (PF) 100 MCG/2ML IJ SOLN
100 MCG/2ML | INTRAMUSCULAR | Status: DC | PRN
Start: 2021-07-16 — End: 2021-07-16
  Administered 2021-07-16: 18:00:00 100 via INTRAVENOUS

## 2021-07-16 MED ORDER — ONDANSETRON HCL 4 MG/2ML IJ SOLN
4 MG/2ML | INTRAMUSCULAR | Status: DC | PRN
Start: 2021-07-16 — End: 2021-07-16
  Administered 2021-07-16: 18:00:00 4 via INTRAVENOUS

## 2021-07-16 MED ORDER — DIPHENHYDRAMINE HCL 50 MG/ML IJ SOLN
50 MG/ML | Freq: Once | INTRAMUSCULAR | Status: DC | PRN
Start: 2021-07-16 — End: 2021-07-16

## 2021-07-16 MED ORDER — METOCLOPRAMIDE HCL 5 MG/ML IJ SOLN
5 MG/ML | Freq: Once | INTRAMUSCULAR | Status: DC | PRN
Start: 2021-07-16 — End: 2021-07-16

## 2021-07-16 MED ORDER — KETOROLAC TROMETHAMINE 30 MG/ML IJ SOLN
30 MG/ML | INTRAMUSCULAR | Status: AC
Start: 2021-07-16 — End: ?

## 2021-07-16 MED ORDER — LACTATED RINGERS IV SOLN
INTRAVENOUS | Status: DC
Start: 2021-07-16 — End: 2021-07-16

## 2021-07-16 MED ORDER — OXYCODONE HCL 5 MG PO CAPS
5 MG | Freq: Once | ORAL | Status: AC | PRN
Start: 2021-07-16 — End: 2021-07-16
  Administered 2021-07-16: 19:00:00 5 mg via ORAL

## 2021-07-16 MED ORDER — TRAMADOL HCL 50 MG PO TABS
50 MG | ORAL_TABLET | Freq: Four times a day (QID) | ORAL | 0 refills | Status: AC | PRN
Start: 2021-07-16 — End: 2021-07-19

## 2021-07-16 MED ORDER — SODIUM CHLORIDE 0.9 % IR SOLN
0.9 % | Status: DC | PRN
Start: 2021-07-16 — End: 2021-07-16
  Administered 2021-07-16: 18:00:00 1000
  Administered 2021-07-16: 18:00:00 3000

## 2021-07-16 MED ORDER — DEXAMETHASONE SODIUM PHOSPHATE 4 MG/ML IJ SOLN
4 MG/ML | INTRAMUSCULAR | Status: DC | PRN
Start: 2021-07-16 — End: 2021-07-16
  Administered 2021-07-16: 18:00:00 4 via INTRAVENOUS

## 2021-07-16 MED ORDER — MIDAZOLAM HCL 2 MG/2ML IJ SOLN
2 MG/ML | INTRAMUSCULAR | Status: AC
Start: 2021-07-16 — End: ?

## 2021-07-16 MED ORDER — ONDANSETRON HCL 4 MG/2ML IJ SOLN
4 MG/2ML | INTRAMUSCULAR | Status: AC
Start: 2021-07-16 — End: ?

## 2021-07-16 MED ORDER — FENTANYL CITRATE (PF) 100 MCG/2ML IJ SOLN
100 MCG/2ML | INTRAMUSCULAR | Status: AC
Start: 2021-07-16 — End: ?

## 2021-07-16 MED ORDER — HYDROMORPHONE HCL PF 1 MG/ML IJ SOLN
1 MG/ML | INTRAMUSCULAR | Status: DC | PRN
Start: 2021-07-16 — End: 2021-07-16

## 2021-07-16 MED FILL — CEFAZOLIN SODIUM 2 G IJ SOLR: 2 g | INTRAMUSCULAR | Qty: 2000

## 2021-07-16 MED FILL — ONDANSETRON HCL 4 MG/2ML IJ SOLN: 4 MG/2ML | INTRAMUSCULAR | Qty: 2

## 2021-07-16 MED FILL — OXYCODONE HCL 5 MG PO CAPS: 5 MG | ORAL | Qty: 1

## 2021-07-16 MED FILL — DIPRIVAN 200 MG/20ML IV EMUL: 200 MG/20ML | INTRAVENOUS | Qty: 20

## 2021-07-16 MED FILL — DEXAMETHASONE SODIUM PHOSPHATE 4 MG/ML IJ SOLN: 4 MG/ML | INTRAMUSCULAR | Qty: 1

## 2021-07-16 MED FILL — MONOJECT FLUSH SYRINGE 0.9 % IV SOLN: 0.9 % | INTRAVENOUS | Qty: 40

## 2021-07-16 MED FILL — FENTANYL CITRATE (PF) 100 MCG/2ML IJ SOLN: 100 MCG/2ML | INTRAMUSCULAR | Qty: 2

## 2021-07-16 MED FILL — MIDAZOLAM HCL 2 MG/2ML IJ SOLN: 2 MG/ML | INTRAMUSCULAR | Qty: 2

## 2021-07-16 MED FILL — LACTATED RINGERS IV SOLN: INTRAVENOUS | Qty: 1000

## 2021-07-16 MED FILL — KETOROLAC TROMETHAMINE 30 MG/ML IJ SOLN: 30 MG/ML | INTRAMUSCULAR | Qty: 1

## 2021-07-16 NOTE — Op Note (Signed)
Operative Report    PATIENT NAME: Rebecca Lamb [facility]  MEDICAL RECORD NO. [id number]  SURGEON: Eula Fried  Primary Care Physician: Oleh Genin, MD     PROCEDURE PERFORMED:  07/16/2021  PREOPERATIVE DIAGNOSIS: Pelvic pain, abnormal bleeding.  Postmenopausal bleeding.  Uterine fibroids  POSTOPERATIVE DIAGNOSIS: Same   PROCEDURE PERFORMED:  Hysteroscopy,fx D and C    SURGEON:  Dr. Eula Fried  ANESTHESIA:  GET  ESTIMATED BLOOD LOSS: Minimal ml  SPECIMEN: [name] and endocervix  COMPLICATIONS:  None immediately appreciated.     DISCUSSION: Rebecca Lamb is a 51 y.o. year old female who presents with postmenopausal bleeding.  She had an ultrasound which showed a thickened endometrium and uterine fibroids.  We tried to do a biopsy in the office but we were unable to accomplish that.  After history and physical examination was performed, potential diagnostic and therapeutic modalities discussed with the patient.  She was given opportunity to ask questions. Once answered informed consent was obtained.  She was brought to operating room on 07/16/2021 for procedure.    PROCEDURE:   The patient was taken to the operating room  where a general anesthetic was administered.  She was then placed in the dorsal lithotomy position, prepped and draped in usual fashion.  The  bladder was drained.  Exam under anesthesia reveals a small anteverted mobile uterus.  The adnexa was negative for mass.  The cervix was exposed.  The anterior lip was grasped with a tenaculum.  The endocervical canal was progressively dilated.  Uterus sounds to 8 cm. Hysteroscope was inserted.  The intrauterine cavity appears normal. Tubal ostia appeared normal.  No evidence of any polyps or  abnormal-appearing endometrium.  A FRACTIONAL D and C was performed after the  hysteroscope was removed.  Endometrial an Endo cervical tissue were obtained    FINDINGS: Endometrial cavity poorly visualized.  Possible false tract created          Electronically signed by Baxter Hire, DO on 07/16/21.    Please note this report has been partially produced using speech recognition software and may cause contain errors related to that system including grammar, punctuation and spelling as well as words and phrases that may seem inappropriate. If there are questions or concerns, please feel free to contact me to clarify.

## 2021-07-16 NOTE — Discharge Instructions (Signed)
Follow instructions given to you by Dr Ozella Rocks and call his office with any questions or concerns.

## 2021-07-16 NOTE — Progress Notes (Signed)
DC instr rev'd with pt and daughter and they state understanding.

## 2021-07-16 NOTE — H&P (Signed)
Department of Obstetrics and Gynecology   History & Physical    Pt Name: Rebecca Lamb  MRN: [id number] [place] #: [id number]  Date of Birth: May 23, 1970  Estimated Date of Delivery: None noted.      HPI: The patient is a 51 y.o. [id number] female  who presents for hysteroscopy and a fractional D&C.  Patient has postmenopausal bleeding and uterine fibroids    Allergies:    Allergies as of 06/19/2021 - Fully Reviewed 06/19/2021   Allergen Reaction Noted    Peanut oil Anaphylaxis 01/03/2017    Dog epithelium allergy skin test  04/24/2021    Grass pollen(k-o-r-t-swt vern)  04/24/2021    Pollen extract  04/24/2021       Medications:  No current facility-administered medications for this encounter.    Current Outpatient Medications:     traMADol (ULTRAM) 50 MG tablet, Take 1 tablet by mouth every 6 hours as needed for Pain for up to 3 days. Intended supply: 3 days. Take lowest dose possible to manage pain Max Daily Amount: 200 mg, Disp: 12 tablet, Rfl: 0    levothyroxine (SYNTHROID) 200 MCG tablet, Take 1 tablet by mouth daily, Disp: , Rfl:     citalopram (CELEXA) 10 MG tablet, Take 1 tablet by mouth daily, Disp: , Rfl:     atorvastatin (LIPITOR) 10 MG tablet, Take 1 tablet by mouth daily, Disp: , Rfl:     metoprolol tartrate (LOPRESSOR) 25 MG tablet, Take 1 tablet by mouth 2 times daily, Disp: , Rfl:     SUMAtriptan (IMITREX) 100 MG tablet, TAKE 1 TABLET BY MOUTH 1 TIME IF NEEDED FOR MIGRAINE. MAY REPEAT AFTER 2 HOURS., Disp: , Rfl:     VENTOLIN HFA 108 (90 BASE) MCG/ACT inhaler, , Disp: , Rfl: 6    albuterol (PROVENTIL) (2.5 MG/3ML) 0.083% nebulizer solution, Take 3 mLs by nebulization every 6 hours as needed for Wheezing or Shortness of Breath, Disp: , Rfl:     OB History: [id number]    Gyn History: Denies h/o abnormal pap smear, h/o STDs.     Past Medical History:   Past Medical History:   Diagnosis Date    Acute right-sided low back pain without sciatica 04/24/2021    Arthritis     Asthma     Back pain     Headache(784.0)      Hyperlipemia 04/24/2021    Hypertension     Hypothyroidism, postsurgical 03/27/2021    Migraine     Mild episode of recurrent major depressive disorder ([place]) 04/24/2021    Seasonal allergic rhinitis 04/24/2021       Past Surgical History:   Past Surgical History:   Procedure Laterality Date    COLONOSCOPY      DENTAL SURGERY      THYROID SURGERY      06/09/2012    THYROID SURGERY      TUBAL LIGATION         Social History:   Social History     Tobacco Use   Smoking Status Never   Smokeless Tobacco Never        Family History: Noncontributory; Denies h/o cancer.     ROS:  Negative except as stated in HPI, denies nausea, vomiting, fever, chills, headache or dysuria.     PE:  Vitals:    07/16/21 1530   BP: 106/69   Pulse: 66   Resp: 16   Temp:    SpO2: 98%       General: well  nourished, well developed, in no acute distress  CV: Normal heart sounds  Resp: breathing unlabored  Abdomen: Nontender, no rebound, no guarding  FH:  Cx:    NST - Cat 1: FHR 140s, moderate variability, +accels, -decels    Labs:       Assessment:   51 y.o. [id number] female with postmenopausal bleeding and uterine fibroids    Plan: Hysteroscopy and a fractional D&C      Baxter Hire, DO

## 2021-07-16 NOTE — Anesthesia Post-Procedure Evaluation (Signed)
Department of Anesthesiology  Postprocedure Note    Patient: Rebecca Lamb  MRN: [id number]  Birthdate: May 01, 1970  Date of evaluation: 07/16/2021      Procedure Summary     Date: 07/16/21 Room / Location: [facility] OR 03 / [facility]    Anesthesia Start: 8938 Anesthesia Stop: 1017    Procedure: HYSTEROSCOPY WITH FRACTION DILATION &CURETTAGE (Cervix) Diagnosis:       PMB (postmenopausal bleeding)      Uterine fibroid      (PMB (postmenopausal bleeding) [N95.0])      (Uterine fibroid [D25.9])    Surgeons: Baxter Hire, DO Responsible Provider: Claire Shown, MD    Anesthesia Type: general ASA Status: 3          Anesthesia Type: No value filed.    Aldrete Phase I: Aldrete Score: 10    Aldrete Phase II:        Anesthesia Post Evaluation    Patient location during evaluation: bedside  Patient participation: complete - patient participated  Level of consciousness: awake  Airway patency: patent  Nausea & Vomiting: no nausea and no vomiting  Complications: no  Cardiovascular status: blood pressure returned to baseline  Respiratory status: acceptable  Hydration status: euvolemic

## 2021-07-16 NOTE — Progress Notes (Signed)
UPPER DENTURE IN LABELED DENTURE CUP IN LABELED BAG ON PATIENT CART.

## 2021-07-16 NOTE — Brief Op Note (Signed)
Brief Postoperative Note      Patient: Rebecca Lamb  Date of Birth: Mar 28, 1970  MRN: [id number]    Date of Procedure: 12-Aug-2021    Pre-Op Diagnosis Codes:     * PMB (postmenopausal bleeding) [N95.0]     * Uterine fibroid [D25.9]    Post-Op Diagnosis: Same       Procedure(s):  HYSTEROSCOPY WITH FRACTION DILATION &CURETTAGE    Surgeon(s):  Baxter Hire, DO    Assistant:  * No surgical staff found *    Anesthesia: General    Estimated Blood Loss (mL): Minimal    Complications: None    Specimens:   ID Type Source Tests Collected by Time Destination   A : [facility] Tissue Cervix SURGICAL PATHOLOGY Baxter Hire, DO 12-Aug-2021 1402    B : [facility] Tissue Endometrium SURGICAL PATHOLOGY Baxter Hire, DO 12-Aug-2021 1402        Implants:  * No implants in log *      Drains: * No LDAs found *    Findings: poss false passage. Scant endometrium      Electronically signed by Baxter Hire, DO on August 12, 2021 at 2:08 PM

## 2021-07-16 NOTE — Anesthesia Pre-Procedure Evaluation (Signed)
Department of Anesthesiology  Preprocedure Note       Name:  Rebecca Lamb   Age:  51 y.o.  DOB:  03-04-1970                                          MRN:  [id number]         Date:  07/16/2021      Surgeon: Juliann Mule):  Baxter Hire, DO    Procedure: Procedure(s):  HYSTEROSCOPY WITH FRACTION DILATION &CURETTAGE    Medications prior to admission:   Prior to Admission medications    Medication Sig Start Date End Date Taking? Authorizing Provider   levothyroxine (SYNTHROID) 200 MCG tablet Take 1 tablet by mouth daily 04/09/21   Historical Provider, MD   citalopram (CELEXA) 10 MG tablet Take 1 tablet by mouth daily 03/17/21   Historical Provider, MD   atorvastatin (LIPITOR) 10 MG tablet Take 1 tablet by mouth daily 03/16/21   Historical Provider, MD   metoprolol tartrate (LOPRESSOR) 25 MG tablet Take 1 tablet by mouth 2 times daily 03/17/21   Historical Provider, MD   SUMAtriptan (IMITREX) 100 MG tablet TAKE 1 TABLET BY MOUTH 1 TIME IF NEEDED FOR MIGRAINE. MAY REPEAT AFTER 2 HOURS. 04/30/21   Historical Provider, MD   VENTOLIN HFA 108 (90 BASE) MCG/ACT inhaler  06/24/13   Historical Provider, MD   albuterol (PROVENTIL) (2.5 MG/3ML) 0.083% nebulizer solution Take 3 mLs by nebulization every 6 hours as needed for Wheezing or Shortness of Breath    Historical Provider, MD       Current medications:    No current facility-administered medications for this encounter.       Allergies:    Allergies   Allergen Reactions   . Peanut Oil Anaphylaxis   . Dog Epithelium Allergy Skin Test      Other reaction(s): Unknown   . Grass Pollen(K-O-R-T-Swt Vern)      Other reaction(s): Unknown   . Ibuprofen      Nausea and vomiting     . Pollen Extract      Other reaction(s): Unknown       Problem List:    Patient Active Problem List   Diagnosis Code   . Weight gain R63.5   . Seasonal allergic rhinitis J30.2   . Rheumatoid arthritis ([place]) M06.9   . Rash R21   . Muscle strain T14.8XXA   . Mild episode of recurrent major depressive disorder (HCC)  F33.0   . Migraine G43.909   . Lower resp. tract infection J22   . Hypothyroidism, postsurgical E89.0   . Hypocalcemia E83.51   . Hyperlipemia E78.5   . Graves' disease E05.00   . Fatigue R53.83   . Essential hypertension I10   . Asthma J45.909   . Acute right-sided low back pain without sciatica M54.50   . Backache M54.9   . PMB (postmenopausal bleeding) N95.0   . Uterine fibroid D25.9   . Pre-operative clearance Z01.818   . Obesity, Class II, BMI 35-39.9 E66.9       Past Medical History:        Diagnosis Date   . Acute right-sided low back pain without sciatica 04/24/2021   . Arthritis    . Asthma    . Back pain    . Headache(784.0)    . Hyperlipemia 04/24/2021   . Hypertension    .  Hypothyroidism, postsurgical 03/27/2021   . Migraine    . Mild episode of recurrent major depressive disorder ([place]) 04/24/2021   . Seasonal allergic rhinitis 04/24/2021       Past Surgical History:        Procedure Laterality Date   . COLONOSCOPY     . DENTAL SURGERY     . THYROID SURGERY      06/09/2012   . THYROID SURGERY     . TUBAL LIGATION         Social History:    Social History     Tobacco Use   . Smoking status: Never   . Smokeless tobacco: Never   Substance Use Topics   . Alcohol use: Not Currently     Comment: ocassionally                                Counseling given: Not Answered      Vital Signs (Current): There were no vitals filed for this visit.                                           BP Readings from Last 3 Encounters:   07/03/21 130/80   06/27/21 126/74   06/19/21 116/74       NPO Status:                                                                                 BMI:   Wt Readings from Last 3 Encounters:   07/03/21 186 lb 9.6 oz (84.6 kg)   06/27/21 183 lb (83 kg)   06/19/21 186 lb (84.4 kg)     There is no height or weight on file to calculate BMI.    CBC:   Lab Results   Component Value Date/Time    WBC 17.1 05/04/2021 10:15 PM    RBC 4.71 05/04/2021 10:15 PM    HGB 14.6 05/04/2021 10:15 PM    HCT 43.1  05/04/2021 10:15 PM    MCV 92 05/04/2021 10:15 PM    PLT 269 05/04/2021 10:15 PM       CMP:   Lab Results   Component Value Date/Time    NA 137 05/04/2021 10:15 PM    K 4.1 05/04/2021 10:15 PM    CL 102 05/04/2021 10:15 PM    CREATININE 0.74 05/04/2021 10:15 PM    GLUCOSE 120 05/04/2021 10:15 PM    PROT 7.8 05/04/2021 10:15 PM    CALCIUM 9.2 05/04/2021 10:15 PM    BILITOT 0.7 05/04/2021 10:15 PM    ALKPHOS 68 05/04/2021 10:15 PM    AST 15 05/04/2021 10:15 PM    ALT 14 05/04/2021 10:15 PM       POC Tests: No results for input(s): POCGLU, POCNA, POCK, POCCL, POCBUN, POCHEMO, POCHCT in the last 72 hours.    Coags:   Lab Results   Component Value Date/Time    PROTIME 11.9 10/28/2020 04:45 PM    INR 1.0 10/28/2020 04:45 PM  APTT 29.9 06/27/2021 09:33 AM       HCG (If Applicable): No results found for: PREGTESTUR, PREGSERUM, HCG, HCGQUANT     ABGs: No results found for: PHART, PO2ART, PCO2ART, HCO3ART, BEART, O2SATART     Type & Screen (If Applicable):  No results found for: LABABO, LABRH    Drug/Infectious Status (If Applicable):  No results found for: HIV, HEPCAB    COVID-19 Screening (If Applicable):   Lab Results   Component Value Date/Time    COVID19 NOT DETECTED 10/28/2020 04:40 PM           Anesthesia Evaluation  Patient summary reviewed and Nursing notes reviewed no history of anesthetic complications:   Airway: Mallampati: II  TM distance: >3 FB   Neck ROM: full  Mouth opening: > = 3 FB   Dental: normal exam         Pulmonary:normal exam    (+) asthma:                            Cardiovascular:    (+) hypertension:,       ECG reviewed               Beta Blocker:  Dose within 24 Hrs         Neuro/Psych:   (+) neuromuscular disease:,             GI/Hepatic/Renal:   (+) morbid obesity          Endo/Other:    (+) hypothyroidism::., .                 Abdominal:   (+) obese,           Vascular: negative vascular ROS.         Other Findings:           Anesthesia Plan      general     ASA 3       Induction:  intravenous.    MIPS: Prophylactic antiemetics administered.  Anesthetic plan and risks discussed with patient.        Attending anesthesiologist reviewed and agrees with Preprocedure content                Claire Shown, MD   07/16/2021

## 2021-07-16 NOTE — Progress Notes (Signed)
Per Dr Ozella Rocks and Dr Erin Sons, no labs required

## 2021-07-26 ENCOUNTER — Ambulatory Visit
Admit: 2021-07-26 | Discharge: 2021-07-26 | Payer: MEDICAID | Attending: Obstetrics & Gynecology | Primary: Family Medicine

## 2021-07-26 DIAGNOSIS — D252 Subserosal leiomyoma of uterus: Secondary | ICD-10-CM

## 2021-07-26 MED ORDER — IBUPROFEN 600 MG PO TABS
600 MG | ORAL_TABLET | ORAL | 0 refills | Status: AC
Start: 2021-07-26 — End: 2022-02-25

## 2021-07-26 MED ORDER — DIAZEPAM 5 MG PO TABS
5 MG | ORAL_TABLET | ORAL | 0 refills | Status: AC
Start: 2021-07-26 — End: 2021-08-09

## 2021-07-26 MED ORDER — MISOPROSTOL 200 MCG PO TABS
200 MCG | ORAL_TABLET | ORAL | 0 refills | Status: AC
Start: 2021-07-26 — End: 2022-02-25

## 2021-07-26 NOTE — Progress Notes (Signed)
HPI:  Rebecca Lamb (DOB: [birth year]) is a 51 y.o. female, Established patient, here for evaluation of the following chief complaint(s):  Post-Op Check (Still bleeding spotting. )  Patient is here for postop evaluation.  There was no evidence of endometrial tissue on her path report my suspicion is that I may have created a false track and actually did not get into the intrauterine cavity.  Patient had no problems postoperatively    SUBJECTIVE/OBJECTIVE:    Past Surgical History:   Procedure Laterality Date    COLONOSCOPY      DENTAL SURGERY      DILATION AND CURETTAGE OF UTERUS N/A 07/16/2021    HYSTEROSCOPY WITH FRACTION DILATION &CURETTAGE performed by Baxter Hire, DO at [facility]      06/09/2012    THYROID SURGERY      TUBAL LIGATION          Review of Systems   Constitutional:  Negative for activity change, appetite change, fatigue and unexpected weight change.   HENT:  Negative for dental problem, ear pain, hearing loss, nosebleeds, sore throat, trouble swallowing and voice change.    Eyes:  Negative for visual disturbance.   Respiratory:  Negative for cough, chest tightness, shortness of breath and wheezing.    Cardiovascular:  Negative for chest pain and palpitations.   Gastrointestinal:  Negative for abdominal distention, abdominal pain, blood in stool, constipation, nausea and vomiting.   Endocrine: Negative for cold intolerance, heat intolerance, polydipsia, polyphagia and polyuria.   Genitourinary:  Negative for difficulty urinating, dyspareunia, dysuria, flank pain, frequency, genital sores, hematuria, menstrual problem, pelvic pain, urgency, vaginal bleeding, vaginal discharge and vaginal pain.   Musculoskeletal:  Negative for arthralgias, back pain, joint swelling and myalgias.   Skin:  Negative for color change and rash.   Allergic/Immunologic: Negative for environmental allergies, food allergies and immunocompromised state.   Neurological:  Negative for dizziness, seizures,  syncope, speech difficulty, weakness, numbness and headaches.   Hematological:  Negative for adenopathy. Does not bruise/bleed easily.   Psychiatric/Behavioral:  Negative for agitation, behavioral problems, confusion, decreased concentration, dysphoric mood and suicidal ideas. The patient is not nervous/anxious and is not hyperactive.      Physical Exam  Vitals and nursing note reviewed.   Constitutional:       Appearance: Normal appearance.   Neurological:      Mental Status: She is alert.       Vitals:    07/26/21 1143   BP: 100/76   Site: Right Upper Arm   Pulse: 76   Weight: 184 lb (83.5 kg)   Height: '4\' 10"'$  (1.473 m)         ASSESSMENT/PLAN:     Diagnosis Orders   1. Fibroids, subserous        2. Postmenopausal bleeding        Unsatisfactory endometrial evaluation    PLAN: We will try to do a Endo C and a endometrial biopsy here in the office.  We will give Cytotec prep and my thought is that since I was able to dilate her cervix may be able to get the [place] and in the office      No follow-ups on file.      On this date 07/26/2021 I have spent 20 minutes reviewing previous notes, test results and face to face with the patient discussing the diagnosis and importance of compliance with the treatment plan as well as documenting on the day  of the visit.      An electronic signature was used to authenticate this note. Please note this report has been partially produced using speech recognition software and may cause contain errors related to that system including grammar, punctuation and spelling as well as words and phrases that may seem inappropriate. If there are questions or concerns please feel free to contact me to clarify.

## 2021-08-07 ENCOUNTER — Encounter

## 2021-08-07 ENCOUNTER — Ambulatory Visit
Admit: 2021-08-07 | Discharge: 2021-08-17 | Payer: MEDICAID | Attending: Obstetrics & Gynecology | Primary: Family Medicine

## 2021-08-07 DIAGNOSIS — N950 Postmenopausal bleeding: Secondary | ICD-10-CM

## 2021-08-07 NOTE — Progress Notes (Signed)
HPI:  Rebecca Lamb (DOB: [birth year]) is a 51 y.o. female, Established patient, here for evaluation of the following chief complaint(s):  Procedure Unknown Jim)    Is here for an endometrial biopsy and an Endo C.  She took the appropriate prep medication.  She has postmenopausal bleeding and uterine fibroids.  She states that 4 to 5 years ago she had a procedure done in [place] where fibroids fibroids were removed and she had a scraping procedure.  Month ago we did a hysteroscopy but I did not get an adequate adequate sample of the endometrium and I's I suspect that I may have created a false tract.    SUBJECTIVE/OBJECTIVE:    Past Surgical History:   Procedure Laterality Date    COLONOSCOPY      DENTAL SURGERY      DILATION AND CURETTAGE OF UTERUS N/A 07/16/2021    HYSTEROSCOPY WITH FRACTION DILATION &CURETTAGE performed by Baxter Hire, DO at [facility]      06/09/2012    THYROID SURGERY      TUBAL LIGATION          Review of Systems   Constitutional:  Negative for activity change, appetite change, fatigue and unexpected weight change.   HENT:  Negative for dental problem, ear pain, hearing loss, nosebleeds, sore throat, trouble swallowing and voice change.    Eyes:  Negative for visual disturbance.   Respiratory:  Negative for cough, chest tightness, shortness of breath and wheezing.    Cardiovascular:  Negative for chest pain and palpitations.   Gastrointestinal:  Negative for abdominal distention, abdominal pain, blood in stool, constipation, nausea and vomiting.   Endocrine: Negative for cold intolerance, heat intolerance, polydipsia, polyphagia and polyuria.   Genitourinary:  Negative for difficulty urinating, dyspareunia, dysuria, flank pain, frequency, genital sores, hematuria, menstrual problem, pelvic pain, urgency, vaginal bleeding, vaginal discharge and vaginal pain.   Musculoskeletal:  Negative for arthralgias, back pain, joint swelling and myalgias.   Skin:  Negative for color change  and rash.   Allergic/Immunologic: Negative for environmental allergies, food allergies and immunocompromised state.   Neurological:  Negative for dizziness, seizures, syncope, speech difficulty, weakness, numbness and headaches.   Hematological:  Negative for adenopathy. Does not bruise/bleed easily.   Psychiatric/Behavioral:  Negative for agitation, behavioral problems, confusion, decreased concentration, dysphoric mood and suicidal ideas. The patient is not nervous/anxious and is not hyperactive.      Physical Exam  Vitals and nursing note reviewed. Exam conducted with a chaperone present.   Genitourinary:     General: Normal vulva.             Vitals:    08/07/21 1353   BP: 126/80   Site: Right Upper Arm   Pulse: 76   Weight: 182 lb (82.6 kg)   Height: '4\' 10"'$  (1.473 m)         ASSESSMENT/PLAN:     Diagnosis Orders   1. Postmenopausal bleeding  PR HYSTEROSCOPY BX ENDOMETRIUM&/POLYPC W/WO D&C    Surgical Pathology          PLAN: Endo C and endometrial biopsy  We will call the patient with results    No follow-ups on file.      On this date 08/07/2021 I have spent 30 minutes reviewing previous notes, test results and face to face with the patient discussing the diagnosis and importance of compliance with the treatment plan as well as documenting on the day of the visit.  An electronic signature was used to authenticate this note. Please note this report has been partially produced using speech recognition software and may cause contain errors related to that system including grammar, punctuation and spelling as well as words and phrases that may seem inappropriate. If there are questions or concerns please feel free to contact me to clarify.

## 2021-08-09 ENCOUNTER — Encounter

## 2021-08-10 NOTE — Telephone Encounter (Signed)
From: Jerilynn Mages  To: Dr. Baxter Hire  Sent: 08/09/2021 4:18 PM EDT  Subject: Question regarding PATHOLOGY TISSUE    Please explain in non medical terms,   What does this mean? Lol

## 2021-08-20 NOTE — Telephone Encounter (Signed)
Patient informed MRI order is in and they will call to schedule as soon as it's approved.

## 2021-09-03 ENCOUNTER — Inpatient Hospital Stay: Admit: 2021-09-03 | Payer: MEDICAID | Primary: Family Medicine

## 2021-09-03 DIAGNOSIS — N950 Postmenopausal bleeding: Secondary | ICD-10-CM

## 2021-09-03 MED ORDER — GADOBENATE DIMEGLUMINE 529 MG/ML IV SOLN
529 MG/ML | Freq: Once | INTRAVENOUS | Status: AC | PRN
Start: 2021-09-03 — End: 2021-09-03
  Administered 2021-09-03: 18:00:00 20 mL via INTRAVENOUS

## 2022-02-25 ENCOUNTER — Ambulatory Visit: Admit: 2022-02-25 | Discharge: 2022-02-25 | Payer: MEDICAID | Attending: Family Medicine | Primary: Family Medicine

## 2022-02-25 DIAGNOSIS — Z0000 Encounter for general adult medical examination without abnormal findings: Secondary | ICD-10-CM

## 2022-02-25 MED ORDER — SUMATRIPTAN SUCCINATE 6 MG/0.5ML SC SOLN
6 MG/0.5ML | Freq: Once | SUBCUTANEOUS | 5 refills | Status: DC | PRN
Start: 2022-02-25 — End: 2023-06-03

## 2022-02-25 MED ORDER — VITAMIN D (ERGOCALCIFEROL) 1.25 MG (50000 UT) PO CAPS
1.2550000 MG (50000 UT) | ORAL_CAPSULE | ORAL | 1 refills | Status: DC
Start: 2022-02-25 — End: 2022-07-08

## 2022-02-25 MED ORDER — PHENTERMINE HCL 37.5 MG PO TABS
37.5 MG | ORAL_TABLET | Freq: Every day | ORAL | 0 refills | Status: DC
Start: 2022-02-25 — End: 2022-03-27

## 2022-02-25 NOTE — Progress Notes (Signed)
02/25/2022        Rebecca Lamb [birth year] is a 52 y.o. female who presents today with:  Chief Complaint   Patient presents with    New Patient    Migraine     Because of rumortoid arthritis makes her vomit for years        HPI:   Rebecca Lamb presents today to establish care.    PMH: Hypertension, migraines, depression, rheumatoid arthritis, allergic rhinitis, obesity, Asthma    Asthma - Patient denies any current wheezing, coughing, shortness of breath.  Tolerating inhalers well.     HTN - Patient denies any current chest pain, shortness of breath, palpitations, diaphoresis, lightheadedness.     Migraines-she states that she gets migraines because of rheumatoid arthritis and she has associated nausea and vomiting.  She states that she gets migraines around once to twice weekly and sumatriptan injections help.  She states that oral medications have not helped including Tylenol, ibuprofen, Excedrin, sumatriptan.    Rheumatoid arthritis-sees rheumatology.  She has lumbosacral arthritis.    Depression- Patient denies any paranoia, visual or auditory hallucinations, suicidal or homicidal ideations.     Health Maintenance  Colonoscopy: Ordered Cologuard today, she reports having a colonoscopy done 5 years ago but does not know [place] as [place].  Pap smear: She has an OB/GYN and will follow-up with them  Mammogram: Ordering today   Shingles VAX: Declined  Tetanus VAX: Declined    Assessment & Plan   1. Routine adult health maintenance  -     Hemoglobin A1C; Future  2. Screening for HIV without presence of risk factors  -     HIV Screen; Future  3. Screening for colon cancer  -     FIT-DNA (Cologuard)  4. Encounter for screening mammogram for malignant neoplasm of breast  -     MAM DIGITAL SCREEN W OR WO CAD BILATERAL; Future  5. Vitamin D deficiency  -     vitamin D (ERGOCALCIFEROL) 1.25 MG (50000 UT) CAPS capsule; Take 1 capsule by mouth once a week, Disp-12 capsule, R-1Normal  6. Hypothyroidism,  postsurgical  -     TSH; Future  -     T4, Free; Future  7. Rheumatoid arthritis, involving unspecified site, unspecified whether rheumatoid factor present ([place])  8. Essential hypertension  9. Obesity, Class II, BMI 35-39.9  -     phentermine (ADIPEX-P) 37.5 MG tablet; Take 1 tablet by mouth every morning (before breakfast) for 30 days. Max Daily Amount: 37.5 mg, Disp-30 tablet, R-0Normal  10. Mixed hyperlipidemia  11. Migraine without status migrainosus, not intractable, unspecified migraine type  -     SUMAtriptan (IMITREX) 6 MG/0.5ML SOLN injection; Inject 0.5 mLs into the skin once as needed for Migraine, Disp-4 mL, R-5Normal  12. Lumbosacral disc disease  -     Ambulatory referral to Orthopedic Surgery     Medications Discontinued During This Encounter   Medication Reason    ibuprofen (ADVIL;MOTRIN) 600 MG tablet Therapy completed    miSOPROStol (CYTOTEC) 200 MCG tablet Therapy completed    SUMAtriptan (IMITREX) 100 MG tablet Therapy completed     No follow-ups on file.    Ordered mammogram, Cologuard, thyroid hormone testing, A1c  Started vitamin D, should have testing done in 6 months  Referral to orthopedics for lumbosacral disease  Started Adipex  Past medical history was reviewed and updated as needed  Denies a history of or any known current conditions that  would contraindicate the use of Adipex.  Understands that Adipex is a controlled substance and has the potential to become addictive.  Understands that it should not be taken if there is any history or tendency towards alcohol or drug abuse.  Possible medication side effects:  fatigue/insomnia, constipation, anxiousness, palpitations, headache, dry mouth.  Understands that if adverse side effects occur, the medication must be discontinued.  Understands that Adipex is a stimulant, so it is best to take it in the morning  Discussed the importance of diet (encouraged low carb diet) and exercising at least 30 minutes 4-5x/week.  Given recommendation to  limit carbs such as pasta, rice, potatoes, bread, sugars including pop or sugary drinks.  Recommended patient to eat diets that emphasize high intakes of vegetables, fruits, nuts, whole grains, lean vegetable or animal protein, and fish. As per 2019 ACC/AHA guideline on primary prevention of CVD   Recommended patient to engage in at least 150 minutes per week of accumulated moderate-intensity physical activity or 75 minutes per week of vigorous-intensity physical activity as per 2019 ACC/AHA guideline on primary prevention of CVD       Objective  Allergies   Allergen Reactions    Peanut Oil Anaphylaxis    Dog Epithelium Allergy Skin Test      Other reaction(s): Unknown    Grass Pollen(K-O-R-T-Swt Vern)      Other reaction(s): Unknown    Ibuprofen      Nausea and vomiting      Pollen Extract      Other reaction(s): Unknown     Current Outpatient Medications   Medication Sig Dispense Refill    vitamin D (ERGOCALCIFEROL) 1.25 MG (50000 UT) CAPS capsule Take 1 capsule by mouth once a week 12 capsule 1    SUMAtriptan (IMITREX) 6 MG/0.5ML SOLN injection Inject 0.5 mLs into the skin once as needed for Migraine 4 mL 5    phentermine (ADIPEX-P) 37.5 MG tablet Take 1 tablet by mouth every morning (before breakfast) for 30 days. Max Daily Amount: 37.5 mg 30 tablet 0    levothyroxine (SYNTHROID) 200 MCG tablet Take 1 tablet by mouth daily      citalopram (CELEXA) 10 MG tablet Take 1 tablet by mouth daily      atorvastatin (LIPITOR) 10 MG tablet Take 1 tablet by mouth daily      metoprolol tartrate (LOPRESSOR) 25 MG tablet Take 1 tablet by mouth 2 times daily      VENTOLIN HFA 108 (90 BASE) MCG/ACT inhaler   6    albuterol (PROVENTIL) (2.5 MG/3ML) 0.083% nebulizer solution Take 3 mLs by nebulization every 6 hours as needed for Wheezing or Shortness of Breath       No current facility-administered medications for this visit.       PMH, Surgical Hx, Family Hx, and Social Hx reviewed and updated.  Health Maintenance  reviewed.    Vitals:    02/25/22 1447   BP: 122/82   Site: Right Upper Arm   Position: Sitting   Cuff Size: Medium Adult   Pulse: 86   SpO2: 96%   Weight: 81.1 kg (178 lb 12.8 oz)   Height: 1.524 m (5')     BP Readings from Last 3 Encounters:   02/25/22 122/82   08/07/21 126/80   07/26/21 100/76     Wt Readings from Last 3 Encounters:   02/25/22 81.1 kg (178 lb 12.8 oz)   08/07/21 82.6 kg (182 lb)   07/26/21 83.5 kg (184  lb)       No results found for: "LABA1C"  Lab Results   Component Value Date    CREATININE 0.74 05/04/2021     Lab Results   Component Value Date    ALT 14 05/04/2021    AST 15 05/04/2021     No results found for: "CHOL", "TRIG", "HDL", "LDLCALC", "LDLDIRECT"     Review of Systems   Physical Exam  Constitutional:       General: She is not in acute distress.     Appearance: Normal appearance. She is not toxic-appearing.   HENT:      Right Ear: Tympanic membrane, ear canal and external ear normal.      Left Ear: Tympanic membrane, ear canal and external ear normal.      Mouth/Throat:      Mouth: Mucous membranes are moist.   Eyes:      Extraocular Movements: Extraocular movements intact.      Pupils: Pupils are equal, round, and reactive to light.   Neck:      Vascular: No carotid bruit.   Cardiovascular:      Rate and Rhythm: Normal rate and regular rhythm.      Pulses: Normal pulses.      Heart sounds: Normal heart sounds.   Pulmonary:      Effort: No respiratory distress.      Breath sounds: No wheezing.   Abdominal:      General: Bowel sounds are normal.      Palpations: Abdomen is soft.      Tenderness: There is no abdominal tenderness.   Musculoskeletal:         General: Tenderness present. Normal range of motion.   Skin:     General: Skin is warm and dry.   Neurological:      Mental Status: She is alert and oriented to person, place, and time. Mental status is at baseline.   Psychiatric:         Mood and Affect: Mood normal.         Behavior: Behavior normal.           On this date 02/25/2022 I  have spent 62 minutes reviewing previous notes, test results and face to face with the patient discussing the diagnosis and importance of compliance with the treatment plan as well as documenting on the day of the visit.    Reviewed with the patient: current clinical status, medications, activities and diet.     Side effects, adverse effects of the medication prescribed today, as well as treatment plan/ rationale and result expectations have been discussed with the patient who expresses understanding and desires to proceed.    Close follow up to evaluate treatment results and for coordination of care.  I have reviewed the patient's medical history in detail and updated the computerized patient record.    Ardith Dark, MD

## 2022-02-25 NOTE — Patient Instructions (Signed)
Please have your mammogram scheduled  Please have your lab work completed, you do not need to fast.  Please complete the Cologuard when it gets sent to your house.  Orthopedic referral placed, they will call you to schedule.  Refilled vitamin D, take once weekly for 6 months then follow-up for testing.    Started Adipex for weight loss.  Follow-up every month in person.  Adipex is a controlled substance and has the potential to become addictive. Understand that it should not be taken if there is any history or tendency towards alcohol or drug abuse.  Possible medication side effects:  fatigue/insomnia, constipation, anxiousness, palpitations, headache, dry mouth.  Understands that if adverse side effects occur, the medication must be discontinued.  Understand that Adipex is a stimulant, so it is best to take it in the morning  Discussed the importance of diet (encouraged low carb diet) and exercising at least 30 minutes 4-5x/week.  Given recommendation to limit carbs such as pasta, rice, potatoes, bread, sugars including pop or sugary drinks.  Recommended patient to eat diets that emphasize high intakes of vegetables, fruits, nuts, whole grains, lean vegetable or animal protein, and fish. As per 2019 ACC/AHA guideline on primary prevention of CVD   Recommended patient to engage in at least 150 minutes per week of accumulated moderate-intensity physical activity or 75 minutes per week of vigorous-intensity physical activity as per 2019 ACC/AHA guideline on primary prevention of CVD

## 2022-03-04 ENCOUNTER — Encounter

## 2022-03-04 ENCOUNTER — Ambulatory Visit: Payer: MEDICAID | Primary: Family Medicine

## 2022-03-04 DIAGNOSIS — Z1231 Encounter for screening mammogram for malignant neoplasm of breast: Secondary | ICD-10-CM

## 2022-03-05 ENCOUNTER — Encounter

## 2022-03-07 ENCOUNTER — Ambulatory Visit: Payer: MEDICAID | Primary: Family Medicine

## 2022-03-08 ENCOUNTER — Encounter: Payer: MEDICAID | Attending: Orthopaedic Surgery | Primary: Family Medicine

## 2022-03-11 ENCOUNTER — Inpatient Hospital Stay: Admit: 2022-03-11 | Payer: MEDICAID | Primary: Family Medicine

## 2022-03-11 ENCOUNTER — Encounter

## 2022-03-11 DIAGNOSIS — R928 Other abnormal and inconclusive findings on diagnostic imaging of breast: Secondary | ICD-10-CM

## 2022-03-14 ENCOUNTER — Telehealth: Admit: 2022-03-14 | Discharge: 2022-03-14 | Payer: MEDICAID | Attending: Family Medicine | Primary: Family Medicine

## 2022-03-14 DIAGNOSIS — J019 Acute sinusitis, unspecified: Secondary | ICD-10-CM

## 2022-03-14 MED ORDER — AMOXICILLIN-POT CLAVULANATE 875-125 MG PO TABS
875-125 | ORAL_TABLET | Freq: Two times a day (BID) | ORAL | 0 refills | Status: AC
Start: 2022-03-14 — End: 2022-03-24

## 2022-03-14 MED ORDER — BENZONATATE 200 MG PO CAPS
200 | ORAL_CAPSULE | Freq: Three times a day (TID) | ORAL | 0 refills | Status: AC | PRN
Start: 2022-03-14 — End: 2022-03-21

## 2022-03-14 MED ORDER — CHLORASEPTIC 1.4 % MT LIQD
1.4 % | OROMUCOSAL | 0 refills | Status: AC | PRN
Start: 2022-03-14 — End: 2022-07-02

## 2022-03-14 NOTE — Progress Notes (Signed)
 Rebecca Lamb, was evaluated through a synchronous (real-time) audio-video encounter. The patient (or guardian if applicable) is aware that this is a billable service, which includes applicable co-pays. This Virtual Visit was conducted with patient's (and/or legal guardian's) consent. Patient identification was verified, and a caregiver was present when appropriate.   The patient was located at Home: [address]  [place] [address]  Provider was located at [facility] [employer] [facility] (Appt Dept): [address]  [address]  [place],  [place] [address]      MANYA BALASH (DOB:  [birth year]) is a Established patient, presenting virtually for evaluation of the following:  Chief Complaint   Patient presents with    Congestion    Cough    Pharyngitis     X1week tx: OTC medication/ halls cough drops         Assessment & Plan   Below is the assessment and plan developed based on review of pertinent history, physical exam, labs, studies, and medications.  1. Acute sinusitis, recurrence not specified, unspecified location  -     benzonatate  (TESSALON ) 200 MG capsule; Take 1 capsule by mouth 3 times daily as needed for Cough, Disp-21 capsule, R-0Normal  -     amoxicillin -clavulanate (AUGMENTIN ) 875-125 MG per tablet; Take 1 tablet by mouth 2 times daily for 10 days, Disp-20 tablet, R-0Normal  -     phenol (CHLORASEPTIC) 1.4 % LIQD mouth spray; Take 1 spray by mouth every 2 hours as needed for Sore Throat, Disp-177 mL, R-0Normal    No follow-ups on file.     Possible strep, covering with Augmentin   Subjective   HPI  Patient presents today due to sickness for 7 days  Pertinent positives: Throat pain, coughing, nasal congestion  Pertinent negatives: Shortness of breath, wheezing, nausea, vomiting, diarrhea  Treatments tried: Mucinex, vitamin C  Sick Contacts: Daughter and grand son both had strep  Review of Systems       Objective   Patient-Reported Vitals  BP Observations: No, remote/electronic monitoring device was not used or able to be  verified  Patient-Reported Weight: 174lbs  Patient-Reported Height: 4'11       Physical Exam             --[name], MD

## 2022-03-25 ENCOUNTER — Encounter: Payer: MEDICAID | Attending: Family Medicine | Primary: Family Medicine

## 2022-03-27 ENCOUNTER — Ambulatory Visit: Admit: 2022-03-27 | Discharge: 2022-03-27 | Payer: MEDICAID | Attending: Family Medicine | Primary: Family Medicine

## 2022-03-27 ENCOUNTER — Encounter

## 2022-03-27 DIAGNOSIS — E669 Obesity, unspecified: Secondary | ICD-10-CM

## 2022-03-27 LAB — TSH: TSH: <0.01 u[IU]/mL — ABNORMAL LOW (ref 0.440–3.860)

## 2022-03-27 LAB — T4, FREE: T4 Free: 2.23 ng/dL — ABNORMAL HIGH (ref 0.84–1.68)

## 2022-03-27 MED ORDER — PHENTERMINE HCL 37.5 MG PO TABS
37.5 MG | ORAL_TABLET | Freq: Every day | ORAL | 0 refills | Status: AC
Start: 2022-03-27 — End: 2022-04-26

## 2022-03-27 NOTE — Progress Notes (Signed)
03/27/2022        Rebecca Lamb [birth year] is a 52 y.o. female who presents today with:  Chief Complaint   Patient presents with    Follow-up     Adipex.    Other     Pt. Was recently sick in february. Whole household sick. Did not get blood work done. Getting it down today.     Pt. Having episodes of persistent hot flashes. Feels like she is constantly hot ever since getting six.        HPI: Follow-up  PMH: Hypertension, migraines, depression, rheumatoid arthritis, allergic rhinitis, obesity, Asthma     Obesity -Obesity- needs refill of Adipex.  Patient started this: 02/25/22. Patient was 178 lbs. Patient is 172 lbs today. Patient denies any current chest pain, shortness of breath, palpitations, insomnia, nausea. They would like to continue the current dose.     She has been experiencing excessive sweating and feeling hot.  History of thyroidectomy and on levothyroxine 200 mcg every other day and 400 mcg on every other day.  Labs pending    Assessment & Plan   1. Obesity, Class II, BMI 35-39.9  -     phentermine (ADIPEX-P) 37.5 MG tablet; Take 1 tablet by mouth every morning (before breakfast) for 30 days. Max Daily Amount: 37.5 mg, Disp-30 tablet, R-0Normal  2. Hyperhidrosis  -     Follicle Stimulating Hormone; Future  -     Luteinizing Hormone; Future     Medications Discontinued During This Encounter   Medication Reason    phentermine (ADIPEX-P) 37.5 MG tablet REORDER     Return in about 4 weeks (around 04/24/2022).    She will have her labs done, possibly thyroid related, she is on a very high dose, if thyroid hormone is still low, recommend endocrinology referral.  Also testing for menopause and will refer to OB/GYN if no results or if abnormal.    Past medical history was reviewed and updated as needed  Denies a history of or any known current conditions that would contraindicate the use of Adipex.  Understands that Adipex is a controlled substance and has the potential to become addictive.  Understands that  it should not be taken if there is any history or tendency towards alcohol or drug abuse.  Possible medication side effects:  fatigue/insomnia, constipation, anxiousness, palpitations, headache, dry mouth.  Understands that if adverse side effects occur, the medication must be discontinued.  Understands that Adipex is a stimulant, so it is best to take it in the morning  Discussed the importance of diet (encouraged low carb diet) and exercising at least 30 minutes 4-5x/week.  Given recommendation to limit carbs such as pasta, rice, potatoes, bread, sugars including pop or sugary drinks.  Recommended patient to eat diets that emphasize high intakes of vegetables, fruits, nuts, whole grains, lean vegetable or animal protein, and fish. As per 2019 ACC/AHA guideline on primary prevention of CVD   Recommended patient to engage in at least 150 minutes per week of accumulated moderate-intensity physical activity or 75 minutes per week of vigorous-intensity physical activity as per 2019 ACC/AHA guideline on primary prevention of CVD       Objective  Allergies   Allergen Reactions    Peanut Oil Anaphylaxis    Dog Epithelium (Canis Lupus Familiaris)      Other reaction(s): Unknown    Grass Pollen(K-O-R-T-Swt Vern)      Other reaction(s): Unknown    Ibuprofen  Nausea and vomiting      Pollen Extract      Other reaction(s): Unknown     Current Outpatient Medications   Medication Sig Dispense Refill    phentermine (ADIPEX-P) 37.5 MG tablet Take 1 tablet by mouth every morning (before breakfast) for 30 days. Max Daily Amount: 37.5 mg 30 tablet 0    phenol (CHLORASEPTIC) 1.4 % LIQD mouth spray Take 1 spray by mouth every 2 hours as needed for Sore Throat 177 mL 0    vitamin D (ERGOCALCIFEROL) 1.25 MG (50000 UT) CAPS capsule Take 1 capsule by mouth once a week 12 capsule 1    SUMAtriptan (IMITREX) 6 MG/0.5ML SOLN injection Inject 0.5 mLs into the skin once as needed for Migraine 4 mL 5    levothyroxine (SYNTHROID) 200 MCG  tablet Take 1 tablet by mouth daily      citalopram (CELEXA) 10 MG tablet Take 1 tablet by mouth daily      atorvastatin (LIPITOR) 10 MG tablet Take 1 tablet by mouth daily      metoprolol tartrate (LOPRESSOR) 25 MG tablet Take 1 tablet by mouth 2 times daily      VENTOLIN HFA 108 (90 BASE) MCG/ACT inhaler   6    albuterol (PROVENTIL) (2.5 MG/3ML) 0.083% nebulizer solution Take 3 mLs by nebulization every 6 hours as needed for Wheezing or Shortness of Breath       No current facility-administered medications for this visit.       PMH, Surgical Hx, Family Hx, and Social Hx reviewed and updated.  Health Maintenance reviewed.    Vitals:    03/27/22 1335   BP: 118/66   Pulse: 86   SpO2: 100%   Weight: 78 kg (172 lb)   Height: 1.499 m (4\' 11" )     BP Readings from Last 3 Encounters:   03/27/22 118/66   02/25/22 122/82   08/07/21 126/80     Wt Readings from Last 3 Encounters:   03/27/22 78 kg (172 lb)   02/25/22 81.1 kg (178 lb 12.8 oz)   08/07/21 82.6 kg (182 lb)       No results found for: "LABA1C"  Lab Results   Component Value Date    CREATININE 0.74 05/04/2021     Lab Results   Component Value Date    ALT 14 05/04/2021    AST 15 05/04/2021     No results found for: "CHOL", "TRIG", "HDL", "LDLCALC", "LDLDIRECT"     Review of Systems   Physical Exam  Constitutional:       General: She is not in acute distress.     Appearance: Normal appearance. She is not toxic-appearing.   HENT:      Right Ear: External ear normal.      Left Ear: External ear normal.      Mouth/Throat:      Mouth: Mucous membranes are moist.   Eyes:      Extraocular Movements: Extraocular movements intact.      Pupils: Pupils are equal, round, and reactive to light.   Neck:      Vascular: No carotid bruit.   Cardiovascular:      Rate and Rhythm: Normal rate and regular rhythm.      Pulses: Normal pulses.      Heart sounds: Normal heart sounds.   Pulmonary:      Effort: No respiratory distress.      Breath sounds: No wheezing.   Abdominal:  General: Bowel sounds are normal.      Palpations: Abdomen is soft.      Tenderness: There is no abdominal tenderness.   Musculoskeletal:         General: Normal range of motion.   Skin:     General: Skin is warm and dry.   Neurological:      Mental Status: She is alert and oriented to person, place, and time. Mental status is at baseline.   Psychiatric:         Mood and Affect: Mood normal.         Behavior: Behavior normal.               Reviewed with the patient: current clinical status, medications, activities and diet.     Side effects, adverse effects of the medication prescribed today, as well as treatment plan/ rationale and result expectations have been discussed with the patient who expresses understanding and desires to proceed.    Close follow up to evaluate treatment results and for coordination of care.  I have reviewed the patient's medical history in detail and updated the computerized patient record.    Ardith Dark, MD

## 2022-03-28 LAB — HEMOGLOBIN A1C: Hemoglobin A1C: 5.7 % (ref 4.8–5.9)

## 2022-03-28 LAB — HIV SCREEN: HIV Ag/Ab: NONREACTIVE

## 2022-03-28 LAB — FECAL DNA COLORECTAL CANCER SCREENING (COLOGUARD)

## 2022-04-22 ENCOUNTER — Ambulatory Visit: Admit: 2022-04-22 | Discharge: 2022-04-22 | Payer: MEDICAID | Attending: Family Medicine | Primary: Family Medicine

## 2022-04-22 DIAGNOSIS — E669 Obesity, unspecified: Secondary | ICD-10-CM

## 2022-04-22 MED ORDER — ESCITALOPRAM OXALATE 5 MG PO TABS
5 | ORAL_TABLET | Freq: Every day | ORAL | 2 refills | Status: DC
Start: 2022-04-22 — End: 2022-06-06

## 2022-04-22 MED ORDER — PHENTERMINE HCL 37.5 MG PO TABS
37.5 | ORAL_TABLET | Freq: Every day | ORAL | 0 refills | Status: AC
Start: 2022-04-22 — End: 2022-05-22

## 2022-04-22 MED ORDER — HYDROXYZINE HCL 25 MG PO TABS
25 | ORAL_TABLET | Freq: Three times a day (TID) | ORAL | 2 refills | Status: DC | PRN
Start: 2022-04-22 — End: 2022-06-06

## 2022-04-22 NOTE — Progress Notes (Signed)
04/22/2022        Rebecca Lamb 1970-08-10 is a 52 y.o. female who presents today with:  Chief Complaint   Patient presents with    Medication Refill     4 week adipex weight check, pt gained 2 lbs but she said she doesn't really have an appetite so she doesn't understand        HPI:   Obesity -Obesity- needs refill of Adipex.  Patient started this: 02/25/22. Patient was 178 lbs. Patient is 174 lbs today. Patient denies any current chest pain, shortness of breath, palpitations, insomnia, nausea. They would like to continue the current dose.     Insomnia - has been using sleep aid with no help.    Anxiety/depression-she takes citalopram 10 mg daily.  She is gaining weight. Patient denies any paranoia, visual or auditory hallucinations, suicidal or homicidal ideations.     Assessment & Plan   1. Obesity, Class II, BMI 35-39.9  -     phentermine (ADIPEX-P) 37.5 MG tablet; Take 1 tablet by mouth every morning (before breakfast) for 30 days. Max Daily Amount: 37.5 mg, Disp-30 tablet, R-0Normal  2. Insomnia, unspecified type  -     hydrOXYzine HCl (ATARAX) 25 MG tablet; Take 1 tablet by mouth 3 times daily as needed for Itching, Disp-90 tablet, R-2Normal  3. Anxiety  -     escitalopram (LEXAPRO) 5 MG tablet; Take 1 tablet by mouth daily, Disp-30 tablet, R-2Normal  4. Current moderate episode of major depressive disorder, unspecified whether recurrent (HCC)  -     escitalopram (LEXAPRO) 5 MG tablet; Take 1 tablet by mouth daily, Disp-30 tablet, R-2Normal     Medications Discontinued During This Encounter   Medication Reason    citalopram (CELEXA) 10 MG tablet Therapy completed    phentermine (ADIPEX-P) 37.5 MG tablet REORDER     No follow-ups on file.    Advised to wean citalopram over 3 to 4 days and to start Lexapro.    Will continue for another month and if doesn't lose 5 lb, will discontinue and send to bariatrics.     Past medical history was reviewed and updated as needed  Denies a history of or any known current  conditions that would contraindicate the use of Adipex.  Understands that Adipex is a controlled substance and has the potential to become addictive.  Understands that it should not be taken if there is any history or tendency towards alcohol or drug abuse.  Possible medication side effects:  fatigue/insomnia, constipation, anxiousness, palpitations, headache, dry mouth.  Understands that if adverse side effects occur, the medication must be discontinued.  Understands that Adipex is a stimulant, so it is best to take it in the morning  Discussed the importance of diet (encouraged low carb diet) and exercising at least 30 minutes 4-5x/week.  States Adipex is working well in conjunction with dieting and exercise and denies any nausea, insomnia, palpitations.  Given recommendation to limit carbs such as pasta, rice, potatoes, bread, sugars including pop or sugary drinks.  Recommended patient to eat diets that emphasize high intakes of vegetables, fruits, nuts, whole grains, lean vegetable or animal protein, and fish. As per 2019 ACC/AHA guideline on primary prevention of CVD   Recommended patient to engage in at least 150 minutes per week of accumulated moderate-intensity physical activity or 75 minutes per week of vigorous-intensity physical activity as per 2019 ACC/AHA guideline on primary prevention of CVD       Objective  Allergies   Allergen Reactions    Peanut Oil Anaphylaxis    Dog Epithelium (Canis Lupus Familiaris)      Other reaction(s): Unknown    Grass Pollen(K-O-R-T-Swt Vern)      Other reaction(s): Unknown    Ibuprofen      Nausea and vomiting      Pollen Extract      Other reaction(s): Unknown     Current Outpatient Medications   Medication Sig Dispense Refill    phentermine (ADIPEX-P) 37.5 MG tablet Take 1 tablet by mouth every morning (before breakfast) for 30 days. Max Daily Amount: 37.5 mg 30 tablet 0    hydrOXYzine HCl (ATARAX) 25 MG tablet Take 1 tablet by mouth 3 times daily as needed for Itching  90 tablet 2    escitalopram (LEXAPRO) 5 MG tablet Take 1 tablet by mouth daily 30 tablet 2    phenol (CHLORASEPTIC) 1.4 % LIQD mouth spray Take 1 spray by mouth every 2 hours as needed for Sore Throat 177 mL 0    vitamin D (ERGOCALCIFEROL) 1.25 MG (50000 UT) CAPS capsule Take 1 capsule by mouth once a week 12 capsule 1    levothyroxine (SYNTHROID) 200 MCG tablet Take 1 tablet by mouth daily      atorvastatin (LIPITOR) 10 MG tablet Take 1 tablet by mouth daily      metoprolol tartrate (LOPRESSOR) 25 MG tablet Take 1 tablet by mouth 2 times daily      VENTOLIN HFA 108 (90 BASE) MCG/ACT inhaler   6    albuterol (PROVENTIL) (2.5 MG/3ML) 0.083% nebulizer solution Take 3 mLs by nebulization every 6 hours as needed for Wheezing or Shortness of Breath      SUMAtriptan (IMITREX) 6 MG/0.5ML SOLN injection Inject 0.5 mLs into the skin once as needed for Migraine 4 mL 5     No current facility-administered medications for this visit.       PMH, Surgical Hx, Family Hx, and Social Hx reviewed and updated.  Health Maintenance reviewed.    Vitals:    04/22/22 1024   BP: 120/72   Pulse: 65   Temp: 97.4 F (36.3 C)   SpO2: 97%   Weight: 78.9 kg (174 lb)   Height: 1.473 m ( )     BP Readings from Last 3 Encounters:   04/22/22 120/72   03/27/22 118/66   02/25/22 122/82     Wt Readings from Last 3 Encounters:   04/22/22 78.9 kg (174 lb)   03/27/22 78 kg (172 lb)   02/25/22 81.1 kg (178 lb 12.8 oz)       Lab Results   Component Value Date    LABA1C 5.7 03/27/2022     Lab Results   Component Value Date    CREATININE 0.74 05/04/2021     Lab Results   Component Value Date    ALT 14 05/04/2021    AST 15 05/04/2021     No results found for: "CHOL", "TRIG", "HDL", "LDLCALC", "LDLDIRECT"     Review of Systems   Physical Exam  Constitutional:       General: She is not in acute distress.     Appearance: Normal appearance. She is obese. She is not toxic-appearing.   HENT:      Right Ear: External ear normal.      Left Ear: External ear  normal.      Mouth/Throat:      Mouth: Mucous membranes are moist.   Eyes:  Extraocular Movements: Extraocular movements intact.      Pupils: Pupils are equal, round, and reactive to light.   Neck:      Vascular: No carotid bruit.   Cardiovascular:      Rate and Rhythm: Normal rate and regular rhythm.      Pulses: Normal pulses.      Heart sounds: Normal heart sounds.   Pulmonary:      Effort: No respiratory distress.      Breath sounds: No wheezing.   Abdominal:      General: Bowel sounds are normal.      Palpations: Abdomen is soft.      Tenderness: There is no abdominal tenderness.   Musculoskeletal:         General: Normal range of motion.   Skin:     General: Skin is warm and dry.   Neurological:      Mental Status: She is alert and oriented to person, place, and time. Mental status is at baseline.   Psychiatric:         Mood and Affect: Mood normal.         Behavior: Behavior normal.               Reviewed with the patient: current clinical status, medications, activities and diet.     Side effects, adverse effects of the medication prescribed today, as well as treatment plan/ rationale and result expectations have been discussed with the patient who expresses understanding and desires to proceed.    Close follow up to evaluate treatment results and for coordination of care.  I have reviewed the patient's medical history in detail and updated the computerized patient record.    Ardith Dark, MD

## 2022-04-22 NOTE — Patient Instructions (Signed)
Take Celexa 5 mg every day for 3 days, then start Lexapro 5 mg the next day.  Or: Takes Celexa 10 mg every other day for 3 doses, then take 1 day off, then start Lexapro 5 mg.    Lose at least 5 pounds by the next visit to continue Adipex.  We also have the option for bariatric program.    Use hydroxyzine 25 to 50 mg as needed for sleep or anxiety

## 2022-05-04 IMAGING — DX DG CERVICAL SPINE COMPLETE 4+V
6 series · 6 of 6 positions shown · non-contrast
Comparison: No prior.

CLINICAL DATA: Bilateral hand numbness.

EXAM:
CERVICAL SPINE - COMPLETE 4+ VIEW

[c-spine lat]
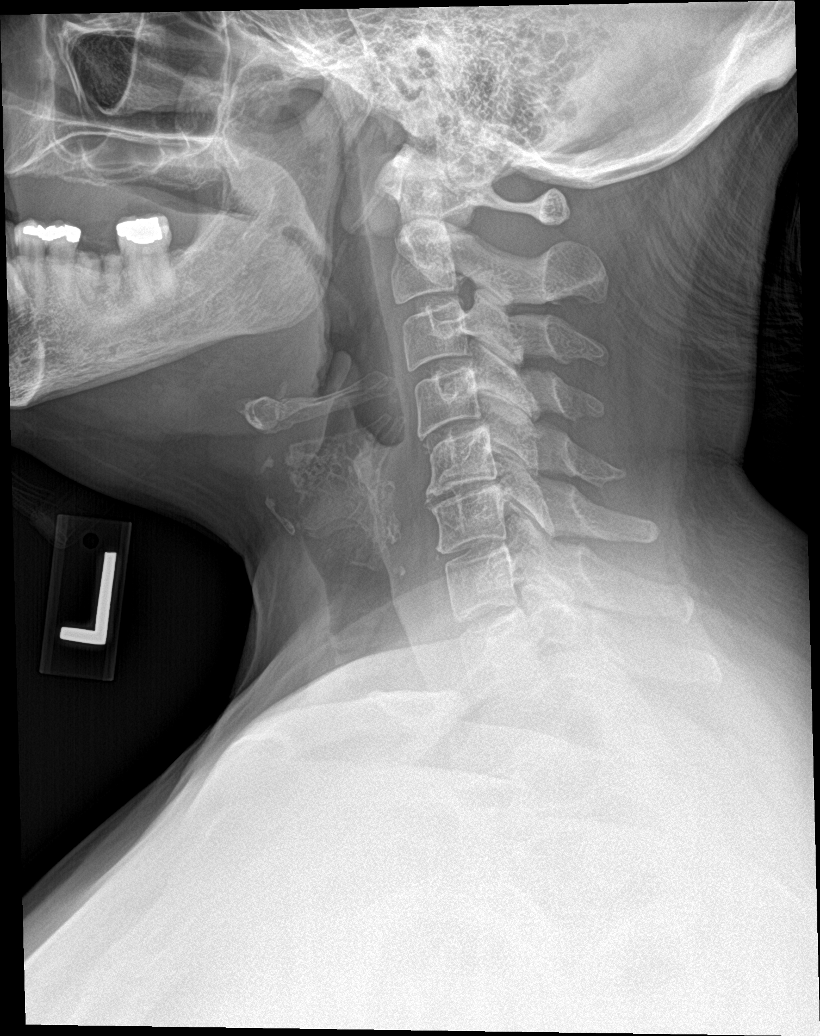

[c-spine obl (1 of 2)]
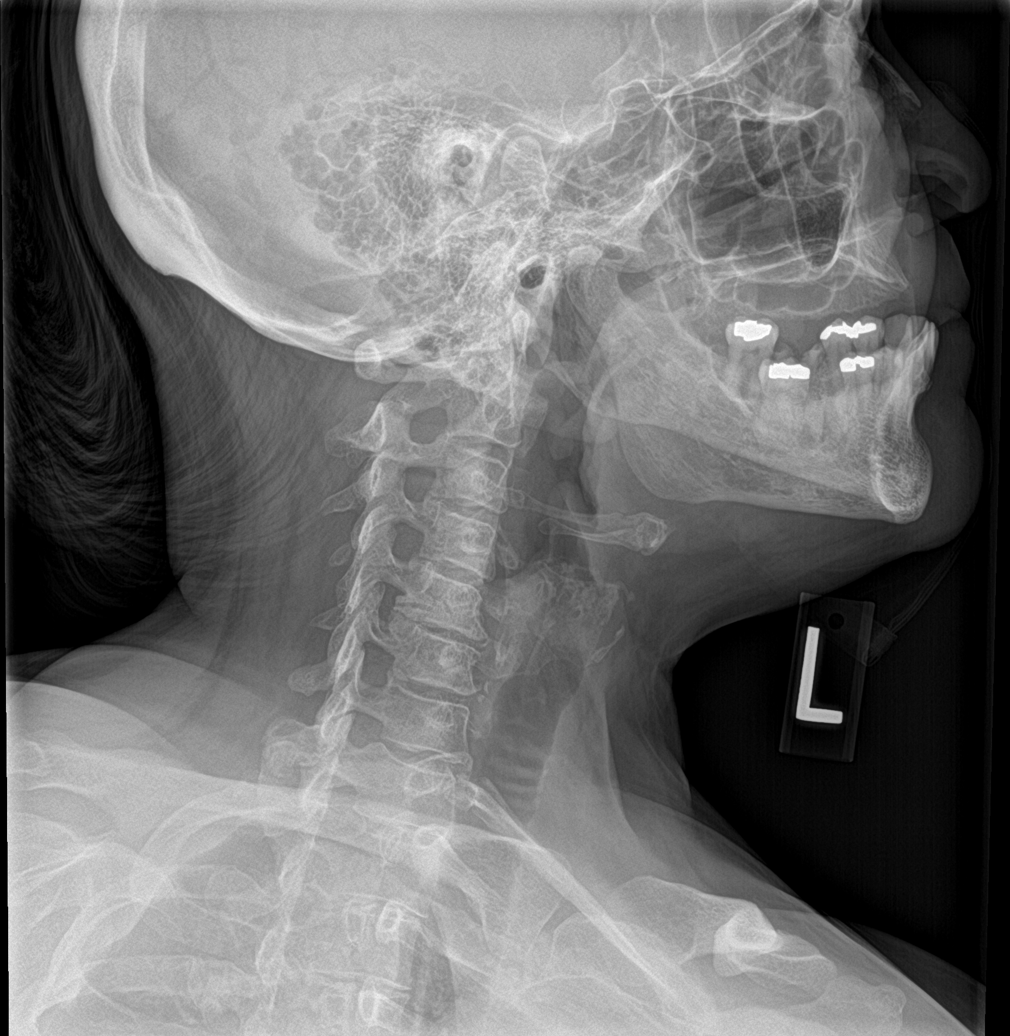

[c-spine obl (2 of 2)]
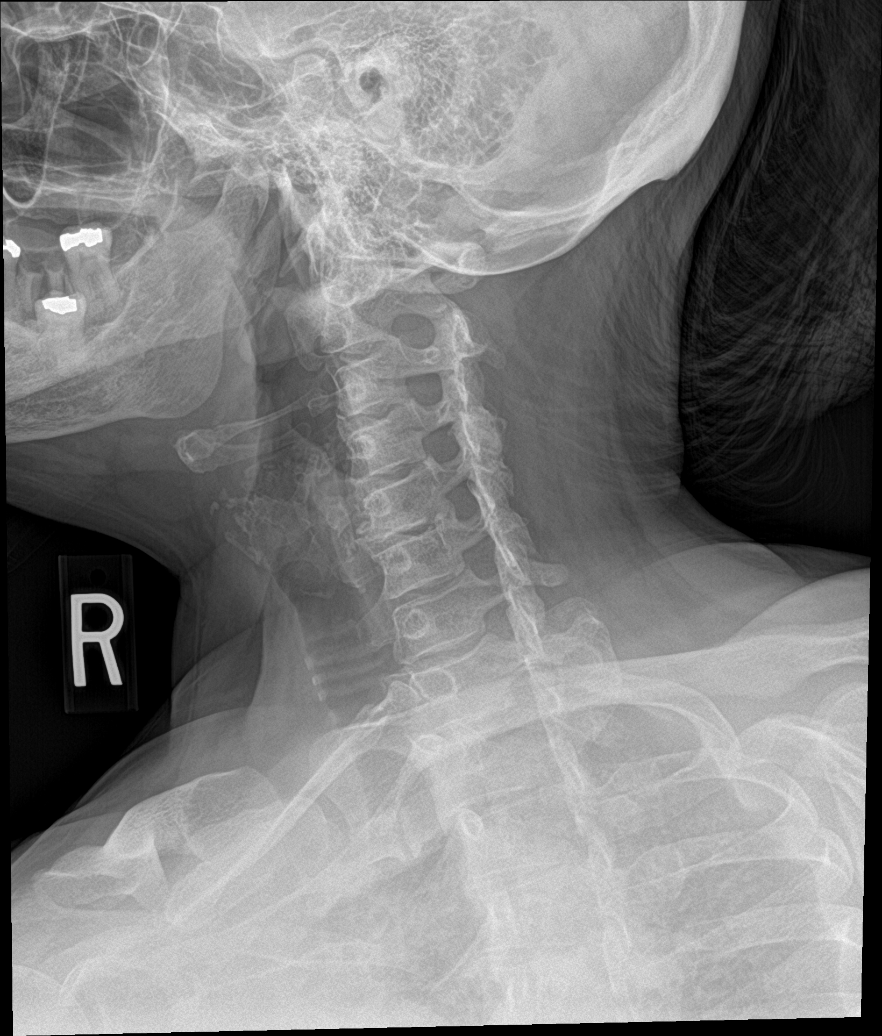

[c-spine ap]
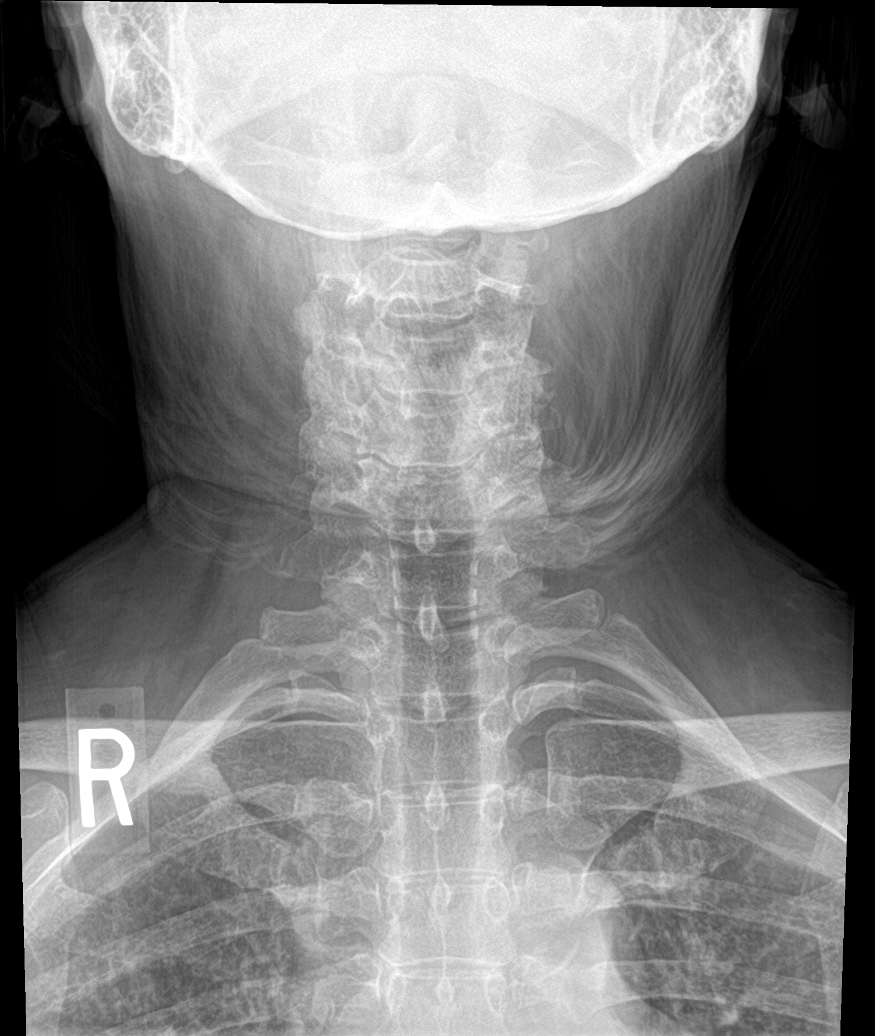

[c-spine open mouth]
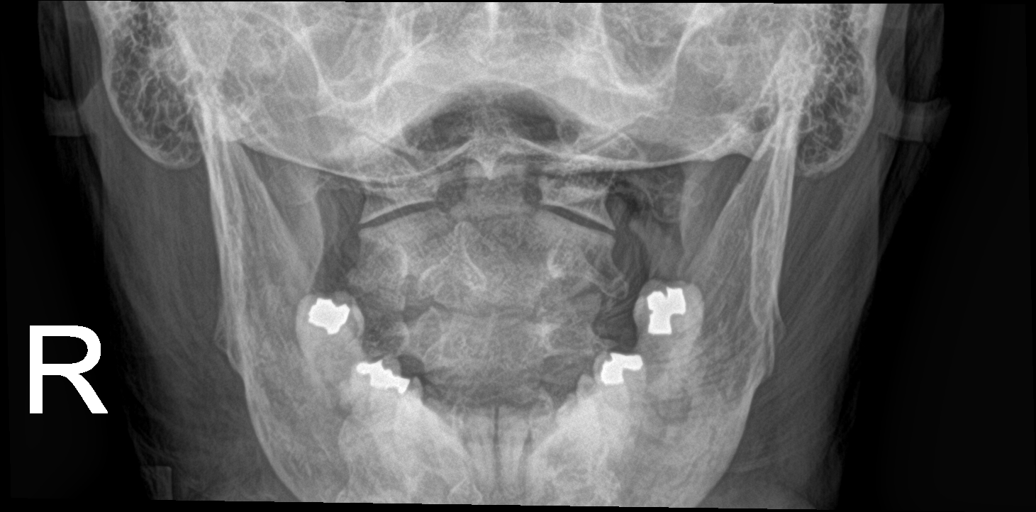

[c-spine swimmers]
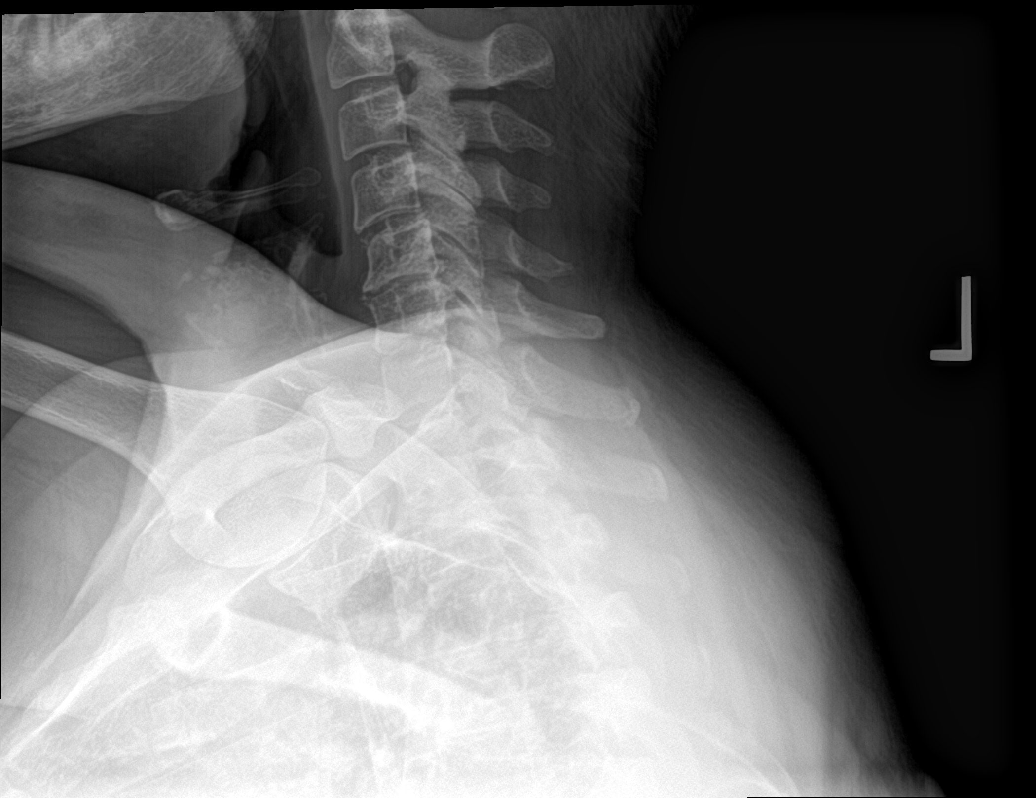

[6 of 6 positions shown; findings below may reference images not displayed]

FINDINGS: Straightening of the cervical spine. C5-C6 disc degeneration with
endplate osteophyte formation. Mild narrowing of the right C5-C6
neural foramen. Diffuse mild facet hypertrophy. No acute bony
abnormality. Pulmonary apices are clear.
IMPRESSION: C5-C6 disc degeneration with endplate osteophyte formation. Mild
narrowing of the right C5-C6 neural foramen at this level. Diffuse
mild facet hypertrophy. No acute abnormality.

## 2022-06-05 ENCOUNTER — Encounter: Payer: MEDICAID | Attending: Family Medicine | Primary: Family Medicine

## 2022-06-06 ENCOUNTER — Ambulatory Visit: Admit: 2022-06-06 | Discharge: 2022-06-06 | Payer: MEDICAID | Attending: Family Medicine | Primary: Family Medicine

## 2022-06-06 DIAGNOSIS — I10 Essential (primary) hypertension: Secondary | ICD-10-CM

## 2022-06-06 MED ORDER — ESCITALOPRAM OXALATE 10 MG PO TABS
10 MG | ORAL_TABLET | Freq: Every day | ORAL | 3 refills | Status: DC
Start: 2022-06-06 — End: 2022-12-27

## 2022-06-06 MED ORDER — LEVOTHYROXINE SODIUM 200 MCG PO TABS
200 | ORAL_TABLET | Freq: Every day | ORAL | 1 refills | Status: AC
Start: 2022-06-06 — End: 2022-12-03

## 2022-06-06 MED ORDER — HYDROXYZINE HCL 25 MG PO TABS
25 | ORAL_TABLET | Freq: Three times a day (TID) | ORAL | 2 refills | Status: AC | PRN
Start: 2022-06-06 — End: 2022-09-04

## 2022-06-06 MED ORDER — METOPROLOL TARTRATE 25 MG PO TABS
25 | ORAL_TABLET | Freq: Two times a day (BID) | ORAL | 1 refills | Status: AC
Start: 2022-06-06 — End: 2022-12-03

## 2022-06-06 MED ORDER — MELOXICAM 7.5 MG PO TABS
7.5 MG | ORAL_TABLET | Freq: Two times a day (BID) | ORAL | 1 refills | Status: DC | PRN
Start: 2022-06-06 — End: 2022-10-14

## 2022-06-06 MED ORDER — ATORVASTATIN CALCIUM 10 MG PO TABS
10 | ORAL_TABLET | Freq: Every day | ORAL | 1 refills | Status: AC
Start: 2022-06-06 — End: 2022-12-03

## 2022-06-06 NOTE — Progress Notes (Signed)
06/06/2022        Rebecca Lamb [birth year] is a 52 y.o. female who presents today with:  Chief Complaint   Patient presents with    Medication Refill     Pt needs refill on her adipex. Pt has had no weight loss in the last month     Pain     Pt states she is having a lot of pain, a lot in her hands        HPI: F/u  PMH: Hypertension, migraines, depression, rheumatoid arthritis, allergic rhinitis, obesity, Asthma     Obesity- needs refill of Adipex.    Patient started this: 02/25/22.   Patient was 178 lbs.   Patient is 174 lbs today.  She has not lost any weight over the last month.  Last month we decided if she did not lose 5 pounds we would discontinue medication and send her to bariatrics.  Patient denies any current chest pain, shortness of breath, palpitations, insomnia, nausea.     Asthma - Patient denies any current wheezing, coughing, shortness of breath.  Tolerating inhalers well.      HTN - Patient denies any current chest pain, shortness of breath, palpitations, diaphoresis, lightheadedness.     Insomnia - has been using hydroxyzine 25 mg with no help.    Migraines-she states that she gets migraines because of rheumatoid arthritis and she has associated nausea and vomiting.  She states that she gets migraines around once to twice weekly and sumatriptan injections help.  She states that oral medications have not helped including Tylenol, ibuprofen, Excedrin, sumatriptan.     Rheumatoid arthritis-sees rheumatology but needs a new referral.  She has lumbosacral arthritis    Anxiety/depression-last month weaned citalopram 10 mg daily.  She is on Lexapro now.  Patient denies any paranoia, visual or auditory hallucinations, suicidal or homicidal ideations.     Hypothyroidism -she was taking levothyroxine 300 mcg daily and her thyroid hormone was elevated.  She is now taking 200 mcg daily and is due for testing.    New problems:  Hand pain-bilateral, has been going on for a while, she believes it is  arthritis      Health Maintenance  Colonoscopy: Ordered Cologuard, she reports having a colonoscopy done 5 years ago but does not know [place] as [place].  Pap smear: She has an OB/GYN and will follow-up with them  Mammogram: 03/11/2022, normal  Shingles VAX: Declined  Tetanus VAX: Declined    Assessment & Plan   1. Essential hypertension  -     metoprolol tartrate (LOPRESSOR) 25 MG tablet; Take 1 tablet by mouth 2 times daily, Disp-180 tablet, R-1Normal  2. Anxiety  -     escitalopram (LEXAPRO) 10 MG tablet; Take 1 tablet by mouth daily, Disp-30 tablet, R-3Normal  3. Current moderate episode of major depressive disorder, unspecified whether recurrent (HCC)  -     escitalopram (LEXAPRO) 10 MG tablet; Take 1 tablet by mouth daily, Disp-30 tablet, R-3Normal  4. Insomnia, unspecified type  -     hydrOXYzine HCl (ATARAX) 25 MG tablet; Take 1 tablet by mouth 3 times daily as needed for Itching, Disp-90 tablet, R-2Normal  5. Migraine without status migrainosus, not intractable, unspecified migraine type  6. Hypothyroidism, postsurgical  -     levothyroxine (SYNTHROID) 200 MCG tablet; Take 1 tablet by mouth daily, Disp-90 tablet, R-1Normal  -     TSH with Reflex; Future  -  T4, Free; Future  7. Mild intermittent asthma, unspecified whether complicated  8. Rheumatoid arthritis of other site, unspecified whether rheumatoid factor present (HCC)  -     meloxicam (MOBIC) 7.5 MG tablet; Take 1 tablet by mouth 2 times daily as needed for Pain, Disp-90 tablet, R-1Normal  -     Amb External Referral To Rheumatology  9. Mixed hyperlipidemia  -     atorvastatin (LIPITOR) 10 MG tablet; Take 1 tablet by mouth daily, Disp-90 tablet, R-1Normal  10. Obesity, Class II, BMI 35-39.9  -     [facility] - Rhett Bannister, MD, Bariatric Surgery, [place]  11. Lumbosacral disc disease  -     meloxicam (MOBIC) 7.5 MG tablet; Take 1 tablet by mouth 2 times daily as needed for Pain, Disp-90 tablet, R-1Normal     Medications Discontinued  During This Encounter   Medication Reason    escitalopram (LEXAPRO) 5 MG tablet LIST CLEANUP    metoprolol tartrate (LOPRESSOR) 25 MG tablet REORDER    levothyroxine (SYNTHROID) 200 MCG tablet REORDER    atorvastatin (LIPITOR) 10 MG tablet REORDER    hydrOXYzine HCl (ATARAX) 25 MG tablet REORDER     No follow-ups on file.    Bariatric referral  Rheumatology referral  Meloxicam as needed  Hydroxyzine 50 mg nightly for sleep  Thyroid hormone testing ordered  Advised to have Cologuard completed again as sample was not processed    Objective  Allergies   Allergen Reactions    Peanut Oil Anaphylaxis    Dog Epithelium (Canis Lupus Familiaris)      Other reaction(s): Unknown    Grass Pollen(K-O-R-T-Swt Vern)      Other reaction(s): Unknown    Ibuprofen      Nausea and vomiting      Pollen Extract      Other reaction(s): Unknown     Current Outpatient Medications   Medication Sig Dispense Refill    methotrexate (RHEUMATREX) 2.5 MG chemo tablet Take by mouth once a week      folic acid (FOLVITE) 1 MG tablet Take 1 tablet by mouth daily      metoprolol tartrate (LOPRESSOR) 25 MG tablet Take 1 tablet by mouth 2 times daily 180 tablet 1    hydrOXYzine HCl (ATARAX) 25 MG tablet Take 1 tablet by mouth 3 times daily as needed for Itching 90 tablet 2    levothyroxine (SYNTHROID) 200 MCG tablet Take 1 tablet by mouth daily 90 tablet 1    atorvastatin (LIPITOR) 10 MG tablet Take 1 tablet by mouth daily 90 tablet 1    escitalopram (LEXAPRO) 10 MG tablet Take 1 tablet by mouth daily 30 tablet 3    meloxicam (MOBIC) 7.5 MG tablet Take 1 tablet by mouth 2 times daily as needed for Pain 90 tablet 1    VENTOLIN HFA 108 (90 BASE) MCG/ACT inhaler   6    phenol (CHLORASEPTIC) 1.4 % LIQD mouth spray Take 1 spray by mouth every 2 hours as needed for Sore Throat (Patient not taking: Reported on 06/06/2022) 177 mL 0    vitamin D (ERGOCALCIFEROL) 1.25 MG (50000 UT) CAPS capsule Take 1 capsule by mouth once a week 12 capsule 1    SUMAtriptan  (IMITREX) 6 MG/0.5ML SOLN injection Inject 0.5 mLs into the skin once as needed for Migraine 4 mL 5    albuterol (PROVENTIL) (2.5 MG/3ML) 0.083% nebulizer solution Take 3 mLs by nebulization every 6 hours as needed for Wheezing or Shortness of Breath  No current facility-administered medications for this visit.       PMH, Surgical Hx, Family Hx, and Social Hx reviewed and updated.  Health Maintenance reviewed.    Vitals:    06/06/22 1458   BP: 122/74   Pulse: 84   SpO2: 98%   Weight: 78.9 kg (174 lb)   Height: 1.499 m (4\' 11" )     BP Readings from Last 3 Encounters:   06/06/22 122/74   04/22/22 120/72   03/27/22 118/66     Wt Readings from Last 3 Encounters:   06/06/22 78.9 kg (174 lb)   04/22/22 78.9 kg (174 lb)   03/27/22 78 kg (172 lb)       Lab Results   Component Value Date    LABA1C 5.7 03/27/2022     Lab Results   Component Value Date    CREATININE 0.74 05/04/2021     Lab Results   Component Value Date    ALT 14 05/04/2021    AST 15 05/04/2021     No results found for: "CHOL", "TRIG", "HDL", "LDLDIRECT"     Review of Systems   Physical Exam  Constitutional:       General: She is not in acute distress.     Appearance: Normal appearance. She is not toxic-appearing.   HENT:      Right Ear: External ear normal.      Left Ear: External ear normal.      Mouth/Throat:      Mouth: Mucous membranes are moist.   Eyes:      Extraocular Movements: Extraocular movements intact.      Pupils: Pupils are equal, round, and reactive to light.   Neck:      Vascular: No carotid bruit.   Cardiovascular:      Rate and Rhythm: Normal rate and regular rhythm.      Pulses: Normal pulses.      Heart sounds: Normal heart sounds.   Pulmonary:      Effort: No respiratory distress.      Breath sounds: No wheezing.   Abdominal:      General: Bowel sounds are normal.      Palpations: Abdomen is soft.      Tenderness: There is no abdominal tenderness.   Musculoskeletal:         General: Normal range of motion.   Skin:     General: Skin  is warm and dry.   Neurological:      Mental Status: She is alert and oriented to person, place, and time. Mental status is at baseline.   Psychiatric:         Mood and Affect: Mood normal.         Behavior: Behavior normal.           On this date 06/06/2022 I have spent 41 minutes reviewing previous notes, test results and face to face with the patient discussing the diagnosis and importance of compliance with the treatment plan as well as documenting on the day of the visit.    Reviewed with the patient: current clinical status, medications, activities and diet.     Side effects, adverse effects of the medication prescribed today, as well as treatment plan/ rationale and result expectations have been discussed with the patient who expresses understanding and desires to proceed.    Close follow up to evaluate treatment results and for coordination of care.  I have reviewed the patient's medical history in detail and updated  the computerized patient record.    Thermon Leyland, MD

## 2022-06-06 NOTE — Patient Instructions (Signed)
Methocarbamol - take 1500 mg every 6 hours  Increased Lexapro to 10 mg daily  Take Hydroxyzine 25-50 mg nightly for sleep

## 2022-06-12 ENCOUNTER — Encounter

## 2022-06-12 LAB — TSH WITH REFLEX: TSH: 0.019 u[IU]/mL — ABNORMAL LOW (ref 0.440–3.860)

## 2022-06-12 LAB — T4, FREE: T4 Free: 1.69 ng/dL — ABNORMAL HIGH (ref 0.84–1.68)

## 2022-06-26 LAB — FECAL DNA COLORECTAL CANCER SCREENING (COLOGUARD): FIT-DNA (Cologuard): NEGATIVE

## 2022-07-02 ENCOUNTER — Encounter

## 2022-07-02 ENCOUNTER — Ambulatory Visit: Admit: 2022-07-02 | Discharge: 2022-07-02 | Payer: MEDICAID | Attending: Internal Medicine | Primary: Family Medicine

## 2022-07-02 DIAGNOSIS — I10 Essential (primary) hypertension: Secondary | ICD-10-CM

## 2022-07-02 LAB — LIPID PANEL
Cholesterol, Total: 138 mg/dL (ref 0–199)
HDL: 49 mg/dL (ref 40–59)
LDL Cholesterol: 62 mg/dL (ref 0–129)
Triglycerides: 136 mg/dL (ref 0–150)

## 2022-07-02 NOTE — Progress Notes (Signed)
Chief Complaint   Patient presents with    Hypertension    Hyperlipidemia       07-03-21: Patient presents for initial medical evaluation. Patient is followed on a regular basis by Dr. Tonita Phoenix, Kathe Mariner, MD. patient presents for preoperative clearance for D&C procedure.  EKG was performed showing a possible anterior septal myocardial infarction.  Set patient status post normal nuclear stress test in September 2022 with ejection fraction of 65%.  There was a fixed small sized defect in the apex which was thought to be apical thinning versus prior infarction.  It was a low risk stress test.  She denies any angina or heart failure type symptoms.  No prior history of myocardial infarction congestive heart failure or arrhythmia.  Positive history of hypertension hyperlipidemia.  No diabetes no smoking.  Lipid profile is abnormal    07-02-22: Patient is doing well overall.  No angina or heart failure type symptoms.  She tolerated surgery well last time.  Set patient status post normal nuclear stress test in September 2022 with ejection fraction of 65%.  There was a fixed small sized defect in the apex which was thought to be apical thinning versus prior infarction.  It was a low risk stress test.  Hemoglobin A1c is normal.  No record of lipid profile  EKG with sinus bradycardia heart rate of 57 no acute ischemia        Patient Active Problem List   Diagnosis    Weight gain    Seasonal allergic rhinitis    Rheumatoid arthritis (HCC)    Rash    Muscle strain    Mild episode of recurrent major depressive disorder (HCC)    Migraine    Lower resp. tract infection    Hypothyroidism, postsurgical    Hypocalcemia    Hyperlipemia    Graves' disease    Fatigue    Essential hypertension    Asthma    Acute right-sided low back pain without sciatica    Backache    PMB (postmenopausal bleeding)    Uterine fibroid    Obesity, Class II, BMI 35-39.9    Postoperative pain       Past Surgical History:   Procedure Laterality Date     COLONOSCOPY      DENTAL SURGERY      DILATION AND CURETTAGE OF UTERUS N/A 07/16/2021    HYSTEROSCOPY WITH FRACTION DILATION &CURETTAGE performed by Jonell Cluck, DO at [facility] OR    THYROID SURGERY      06/09/2012    THYROID SURGERY      TUBAL LIGATION         Social History     Socioeconomic History    Marital status: Divorced   Tobacco Use    Smoking status: Never    Smokeless tobacco: Never   Vaping Use    Vaping Use: Never used   Substance and Sexual Activity    Alcohol use: Not Currently     Comment: ocassionally    Drug use: No    Sexual activity: Not Currently     Partners: Male   Social History Narrative    ** Merged History Encounter **          Social Determinants of Health     Financial Resource Strain: Low Risk  (06/07/2021)    Overall Financial Resource Strain (CARDIA)     Difficulty of Paying Living Expenses: Not hard at all   Food Insecurity:   Recent Concern: Food  Insecurity - Food Insecurity Present (06/07/2021)    Hunger Vital Sign     Worried About Running Out of Food in the Last Year: Often true     Ran Out of Food in the Last Year: Often true   Transportation Needs: Unknown (06/07/2021)    PRAPARE - Therapist, art (Non-Medical): No   Physical Activity: Sufficiently Active (03/07/2022)    Exercise Vital Sign     Days of Exercise per Week: 5 days     Minutes of Exercise per Session: 40 min   Housing Stability: Unknown (06/07/2021)    Housing Stability Vital Sign     Unstable Housing in the Last Year: No       Family History   Problem Relation Age of Onset    Depression Mother     Diabetes Mother     High Cholesterol Mother     Asthma Mother     Cancer Father         brain and lung cancer    Diabetes Father     Cancer Sister     Diabetes Brother     Diabetes Brother     Breast Cancer Maternal Aunt     Breast Cancer Paternal Aunt     Other Son         disabled    Asthma Daughter     Rheum Arthritis Daughter     Cervical Cancer Neg Hx     Ovarian Cancer Neg Hx     Uterine Cancer Neg  Hx        Current Outpatient Medications   Medication Sig Dispense Refill    methotrexate (RHEUMATREX) 2.5 MG chemo tablet Take by mouth once a week      folic acid (FOLVITE) 1 MG tablet Take 1 tablet by mouth daily      metoprolol tartrate (LOPRESSOR) 25 MG tablet Take 1 tablet by mouth 2 times daily 180 tablet 1    hydrOXYzine HCl (ATARAX) 25 MG tablet Take 1 tablet by mouth 3 times daily as needed for Itching 90 tablet 2    levothyroxine (SYNTHROID) 200 MCG tablet Take 1 tablet by mouth daily 90 tablet 1    atorvastatin (LIPITOR) 10 MG tablet Take 1 tablet by mouth daily 90 tablet 1    escitalopram (LEXAPRO) 10 MG tablet Take 1 tablet by mouth daily 30 tablet 3    meloxicam (MOBIC) 7.5 MG tablet Take 1 tablet by mouth 2 times daily as needed for Pain 90 tablet 1    vitamin D (ERGOCALCIFEROL) 1.25 MG (50000 UT) CAPS capsule Take 1 capsule by mouth once a week 12 capsule 1    VENTOLIN HFA 108 (90 BASE) MCG/ACT inhaler   6    albuterol (PROVENTIL) (2.5 MG/3ML) 0.083% nebulizer solution Take 3 mLs by nebulization every 6 hours as needed for Wheezing or Shortness of Breath      SUMAtriptan (IMITREX) 6 MG/0.5ML SOLN injection Inject 0.5 mLs into the skin once as needed for Migraine 4 mL 5     No current facility-administered medications for this visit.       Peanut oil, Dog epithelium (canis lupus familiaris), Grass pollen(k-o-r-t-swt vern), Ibuprofen, and Pollen extract    Review of Systems:  General ROS: negative  Psychological ROS: negative  Hematological and Lymphatic ROS: No history of blood clots or bleeding disorder.   Respiratory ROS: no cough, shortness of breath, or wheezing  Cardiovascular ROS: no chest pain  or dyspnea on exertion  Gastrointestinal ROS: no abdominal pain, change in bowel habits, or black or bloody stools  Genito-Urinary ROS: no dysuria, trouble voiding, or hematuria  Musculoskeletal ROS: negative  Neurological ROS: no TIA or stroke symptoms  Dermatological ROS: negative    VITALS:  Blood  pressure 110/80, pulse 57, weight 79.8 kg (176 lb), last menstrual period 01/08/2004.  Body mass index is 35.55 kg/m.    Physical Examination:  General appearance - alert, well appearing, and in no distress  Mental status - alert, oriented to person, place, and time  Neck - Neck is supple, no JVD or carotid bruits.  No thyromegaly or adenopathy.   Chest - clear to auscultation, no wheezes, rales or rhonchi, symmetric air entry  Heart - normal rate, regular rhythm, normal S1, S2, no murmurs, rubs, clicks or gallops  Abdomen - soft, nontender, nondistended, no masses or organomegaly  Neurological - alert, oriented, normal speech, no focal findings or movement disorder noted  Extremities - peripheral pulses normal, no pedal edema, no clubbing or cyanosis  Skin - normal coloration and turgor, no rashes, no suspicious skin lesions noted      EKG: normal sinus rhythm, nonspecific ST and T waves changes    Orders Placed This Encounter   Procedures    Lipid Panel    EKG 12 Lead       ASSESSMENT:     Diagnosis Orders   1. Essential hypertension  EKG 12 Lead      2. Mixed hyperlipidemia  Lipid Panel            PLAN:       As always, aggressive risk factor modification is strongly recommended. We should adhere to the JNC VIII guidelines for HTN management and the NCEP ATP III guidelines for LDL-C management.    Cardiac diet is always recommended with low fat, cholesterol, calories and sodium.    Continue medications at current doses.     Lipitor 10 mg daily. Check lipid profile.     Check ECHO in future given HTN    Check EKG    Patient was advised and encouraged to check blood pressure at home or at a pharmacy, maintain a logbook, and also call us back if blood pressure are above the target ranges or if it is low. Patient clearly understands and agrees to the instructions.     We will need to continue to monitor muscle and liver enzymes, BUN, CR, and electrolytes.

## 2022-07-07 ENCOUNTER — Encounter

## 2022-07-08 MED ORDER — VITAMIN D (ERGOCALCIFEROL) 1.25 MG (50000 UT) PO CAPS
1.2550000 MG (50000 UT) | ORAL_CAPSULE | ORAL | 1 refills | Status: AC
Start: 2022-07-08 — End: 2023-01-22

## 2022-09-02 ENCOUNTER — Encounter

## 2022-09-03 MED ORDER — HYDROXYZINE HCL 25 MG PO TABS
25 | ORAL_TABLET | Freq: Three times a day (TID) | ORAL | 0 refills | Status: DC | PRN
Start: 2022-09-03 — End: 2022-12-12

## 2022-10-13 ENCOUNTER — Encounter

## 2022-10-14 MED ORDER — MELOXICAM 7.5 MG PO TABS
7.5 | ORAL_TABLET | Freq: Two times a day (BID) | ORAL | 2 refills | Status: AC
Start: 2022-10-14 — End: ?

## 2022-11-13 MED ORDER — MOXIFLOXACIN HCL 0.5 % OP SOLN
0.5 | Freq: Three times a day (TID) | OPHTHALMIC | 0 refills | Status: AC
Start: 2022-11-13 — End: 2022-11-20

## 2022-12-12 ENCOUNTER — Encounter: Admit: 2022-12-12 | Admitting: Family Medicine

## 2022-12-12 DIAGNOSIS — G4700 Insomnia, unspecified: Secondary | ICD-10-CM

## 2022-12-12 MED ORDER — HYDROXYZINE HCL 25 MG PO TABS
25 MG | ORAL_TABLET | Freq: Three times a day (TID) | ORAL | 0 refills | Status: AC | PRN
Start: 2022-12-12 — End: 2023-01-20

## 2022-12-12 NOTE — Telephone Encounter (Signed)
Pt scheduled 1/6

## 2022-12-26 ENCOUNTER — Encounter: Admit: 2022-12-26 | Admitting: Family Medicine

## 2022-12-26 DIAGNOSIS — F419 Anxiety disorder, unspecified: Secondary | ICD-10-CM

## 2022-12-26 NOTE — Telephone Encounter (Signed)
 Rx request   Requested Prescriptions     Pending Prescriptions Disp Refills    escitalopram (LEXAPRO) 10 MG tablet [Pharmacy Med Name: ESCITALOPRAM 10 MG TABLET] 90 tablet 1     Sig: TAKE 1 TABLET BY MOUTH EVERY DAY     LOV 06/06/2022  Next Visit Date:  Fut

## 2022-12-27 MED ORDER — ESCITALOPRAM OXALATE 10 MG PO TABS
10 | ORAL_TABLET | Freq: Every day | ORAL | 0 refills | Status: DC
Start: 2022-12-27 — End: 2023-09-11

## 2023-01-13 ENCOUNTER — Telehealth
Admit: 2023-01-13 | Discharge: 2023-01-13 | Payer: Medicaid (Managed Care) | Attending: Family Medicine | Admitting: Family Medicine | Primary: Family Medicine

## 2023-01-13 ENCOUNTER — Encounter: Payer: MEDICAID | Attending: Family Medicine | Primary: Family Medicine

## 2023-01-13 DIAGNOSIS — M059 Rheumatoid arthritis with rheumatoid factor, unspecified: Secondary | ICD-10-CM

## 2023-01-13 MED ORDER — LEVOTHYROXINE SODIUM 200 MCG PO TABS
200 | ORAL_TABLET | Freq: Every day | ORAL | 1 refills | Status: DC
Start: 2023-01-13 — End: 2023-09-11

## 2023-01-13 MED ORDER — METOPROLOL TARTRATE 25 MG PO TABS
25 | ORAL_TABLET | Freq: Two times a day (BID) | ORAL | 1 refills | Status: DC
Start: 2023-01-13 — End: 2023-09-19

## 2023-01-13 MED ORDER — UBRELVY 50 MG PO TABS
50 | ORAL_TABLET | ORAL | 5 refills | Status: DC
Start: 2023-01-13 — End: 2023-09-19

## 2023-01-13 MED ORDER — ATORVASTATIN CALCIUM 10 MG PO TABS
10 | ORAL_TABLET | Freq: Every day | ORAL | 1 refills | Status: DC
Start: 2023-01-13 — End: 2023-09-11

## 2023-01-13 MED ORDER — FOLIC ACID 1 MG PO TABS
1 | ORAL_TABLET | Freq: Every day | ORAL | 1 refills | Status: DC
Start: 2023-01-13 — End: 2023-07-14

## 2023-01-13 MED ORDER — MELOXICAM 7.5 MG PO TABS
7.5 | ORAL_TABLET | Freq: Two times a day (BID) | ORAL | 1 refills | Status: DC
Start: 2023-01-13 — End: 2023-09-19

## 2023-01-13 NOTE — Assessment & Plan Note (Signed)
 Orders:    [place] - Misischia, Richard DO, Rheumatology - [place]    meloxicam (MOBIC) 7.5 MG tablet; Take 1 tablet by mouth 2 times daily

## 2023-01-13 NOTE — Assessment & Plan Note (Signed)
 Orders:    Vitamin D 25 Hydroxy; Future

## 2023-01-13 NOTE — Assessment & Plan Note (Signed)
 Orders:    Ubrogepant (UBRELVY) 50 MG TABS; Take 1 tablet by mouth as needed for migraine.  Can repeat every 2 hours until maximum of 200 mg / 24 hours.

## 2023-01-13 NOTE — Assessment & Plan Note (Signed)
 Orders:    TSH; Future    T4, Free; Future    levothyroxine (SYNTHROID) 200 MCG tablet; Take 1 tablet by mouth daily

## 2023-01-13 NOTE — Assessment & Plan Note (Signed)
 Orders:    metoprolol tartrate (LOPRESSOR) 25 MG tablet; Take 1 tablet by mouth 2 times daily

## 2023-01-13 NOTE — Progress Notes (Signed)
 Rebecca Lamb, was evaluated through a synchronous (real-time) audio-video encounter. The patient (or guardian if applicable) is aware that this is a billable service, which includes applicable co-pays. This Virtual Visit was conducted with patient's (and/or legal guardian's) consent. Patient identification was verified, and a caregiver was present when appropriate.   The patient was located at Home: [address]  [place] [address]  Provider was located at [employer] (Appt Dept): [address]  [address]  [place],  [place] [address]  Confirm you are appropriately licensed, registered, or certified to deliver care in the state where the patient is located as indicated above. If you are not or unsure, please re-schedule the visit: Yes, I confirm.     Rebecca Lamb (DOB:  Sep 26, 1970) is a Established patient, presenting virtually for evaluation of the following:      Below is the assessment and plan developed based on review of pertinent history, physical exam, labs, studies, and medications.     Assessment & Plan  Rheumatoid arthritis with positive rheumatoid factor, involving unspecified site [facility])       Orders:    [place] - Misischia, Richard DO, Rheumatology - [place]    Comprehensive Metabolic Panel; Future    CBC with Auto Differential; Future    folic acid  (FOLVITE ) 1 MG tablet; Take 1 tablet by mouth daily    meloxicam  (MOBIC ) 7.5 MG tablet; Take 1 tablet by mouth 2 times daily    Essential hypertension       Orders:    metoprolol  tartrate (LOPRESSOR ) 25 MG tablet; Take 1 tablet by mouth 2 times daily    GAD (generalized anxiety disorder)            MDD (major depressive disorder), recurrent, in partial remission (HCC)            Insomnia, unspecified type            Vitamin D  deficiency       Orders:    Vitamin D  25 Hydroxy; Future    Lumbosacral disc disease       Orders:    [place] - Misischia, Richard DO, Rheumatology - [place]    meloxicam  (MOBIC ) 7.5 MG tablet; Take 1 tablet by mouth 2 times  daily    Screening for diabetes mellitus       Orders:    Hemoglobin A1C; Future    Hypothyroidism, postsurgical       Orders:    TSH; Future    T4, Free; Future    levothyroxine  (SYNTHROID ) 200 MCG tablet; Take 1 tablet by mouth daily    Mixed hyperlipidemia       Orders:    Comprehensive Metabolic Panel; Future    atorvastatin  (LIPITOR) 10 MG tablet; Take 1 tablet by mouth daily    Migraine without status migrainosus, not intractable, unspecified migraine type       Orders:    Ubrogepant  (UBRELVY ) 50 MG TABS; Take 1 tablet by mouth as needed for migraine.  Can repeat every 2 hours until maximum of 200 mg / 24 hours.    Mild intermittent asthma, unspecified whether complicated              No follow-ups on file.       Subjective   HPI  PMH: Hypertension, migraines, depression, rheumatoid arthritis, allergic rhinitis, obesity, Asthma      Asthma  Patient denies any current wheezing, coughing, shortness of breath.  Tolerating inhalers well.  HTN  Patient denies any current chest pain, shortness of breath, palpitations, diaphoresis, lightheadedness.      Insomnia  has been using hydroxyzine  25 to 50 mg.     Migraines  she states that she gets migraines because of rheumatoid arthritis and she has associated nausea and vomiting.  She states that she gets migraines around once to twice weekly and sumatriptan  injections help however they are not currently working.  She states that oral medications have not helped including Tylenol, ibuprofen , Excedrin, sumatriptan .     Rheumatoid arthritis  sees rheumatology but needs new referral.  Diagnosed with RA in [place]  ~2020 she had inflammatory arthritis in hands, feet, knees. She has significant fatigue and hair thinning, dry mouth and hand/foot numbness. Positive ?serologies. She has been on methotrexate and Plaquenil with multiple interruptions. She is on daily folic acid .     Anxiety/depression  In the past she has weaned off of Celexa.  She is on Lexapro  and  hydroxyzine  now.  Patient denies any paranoia, visual or auditory hallucinations, suicidal or homicidal ideations.      Hypothyroidism  she was taking levothyroxine  400 mcg daily and her thyroid hormone was elevated.  She is now taking 200 mcg daily and last testing showed barely elevated T4.  Due for retesting     Vitamin D  deficiency  Taking weekly vitamin D  50,000 units.  Last lab: 10/2021, level was 12        Health Maintenance  Colonoscopy: Cologuard normal 06/2022  Pap smear: She has an OB/GYN and will follow-up with them  Mammogram: 03/11/2022, normal  Shingles VAX: Declined  Tetanus VAX: Declined    Review of Systems       Objective   Patient-Reported Vitals  No data recorded     Physical Exam             --Rebecca GORMAN Regan, MD

## 2023-01-13 NOTE — Assessment & Plan Note (Signed)
 Orders:    [place] - Misischia, Richard DO, Rheumatology - [place]    Comprehensive Metabolic Panel; Future    CBC with Auto Differential; Future    folic acid  (FOLVITE ) 1 MG tablet; Take 1 tablet by mouth daily    meloxicam  (MOBIC ) 7.5 MG tablet; Take 1 tablet by mouth 2 times daily

## 2023-01-13 NOTE — Assessment & Plan Note (Signed)
Orders:    Comprehensive Metabolic Panel; Future    atorvastatin (LIPITOR) 10 MG tablet; Take 1 tablet by mouth daily

## 2023-01-17 ENCOUNTER — Encounter

## 2023-01-17 LAB — COMPREHENSIVE METABOLIC PANEL
ALT: 13 U/L (ref 0–33)
AST: 18 U/L (ref 0–35)
Albumin: 4 g/dL (ref 3.5–4.6)
Alkaline Phosphatase: 69 U/L (ref 40–130)
Anion Gap: 11 meq/L (ref 9–15)
BUN: 12 mg/dL (ref 6–20)
CO2: 24 meq/L (ref 20–31)
Calcium: 8.3 mg/dL — ABNORMAL LOW (ref 8.5–9.9)
Chloride: 102 meq/L (ref 95–107)
Creatinine: 0.64 mg/dL (ref 0.50–0.90)
Est, Glom Filt Rate: >90 (ref >60)
Globulin: 3.1 g/dL (ref 2.3–3.5)
Glucose: 99 mg/dL (ref 70–99)
Potassium: 4 meq/L (ref 3.4–4.9)
Sodium: 137 meq/L (ref 135–144)
Total Bilirubin: 0.5 mg/dL (ref 0.2–0.7)
Total Protein: 7.1 g/dL (ref 6.3–8.0)

## 2023-01-17 LAB — CBC WITH AUTO DIFFERENTIAL
Basophils %: 0.6 %
Basophils Absolute: 0 10*3/uL (ref 0.0–0.2)
Eosinophils %: 2.3 %
Eosinophils Absolute: 0.2 10*3/uL (ref 0.0–0.7)
Hematocrit: 41.7 % (ref 37.0–47.0)
Hemoglobin: 14.5 g/dL (ref 12.0–16.0)
Lymphocytes %: 35.2 %
Lymphocytes Absolute: 2.4 10*3/uL (ref 1.0–4.8)
MCH: 31.8 pg — ABNORMAL HIGH (ref 27.0–31.3)
MCHC: 34.8 % (ref 33.0–37.0)
MCV: 91.4 fL (ref 79.4–94.8)
Monocytes %: 8.5 %
Monocytes Absolute: 0.6 10*3/uL (ref 0.2–0.8)
Neutrophils %: 53.3 %
Neutrophils Absolute: 3.7 10*3/uL (ref 1.4–6.5)
Platelets: 287 10*3/uL (ref 130–400)
RBC: 4.56 M/uL (ref 4.20–5.40)
RDW: 12 % (ref 11.5–14.5)
WBC: 6.9 10*3/uL (ref 4.8–10.8)

## 2023-01-17 LAB — T4, FREE: T4 Free: 1.68 ng/dL (ref 0.84–1.68)

## 2023-01-17 LAB — TSH: TSH: 0.276 u[IU]/mL — ABNORMAL LOW (ref 0.440–3.860)

## 2023-01-18 LAB — VITAMIN D 25 HYDROXY: Vit D, 25-Hydroxy: 25.1 ng/mL — ABNORMAL LOW (ref 30.0–100.0)

## 2023-01-18 LAB — HEMOGLOBIN A1C
Estimated Avg Glucose: 111 mg/dL
Hemoglobin A1C: 5.5 % (ref 4.0–6.0)

## 2023-01-20 MED ORDER — HYDROXYZINE HCL 25 MG PO TABS
25 | ORAL_TABLET | Freq: Three times a day (TID) | ORAL | 1 refills | Status: DC | PRN
Start: 2023-01-20 — End: 2023-09-19

## 2023-01-22 ENCOUNTER — Encounter

## 2023-01-22 MED ORDER — VITAMIN D (ERGOCALCIFEROL) 1.25 MG (50000 UT) PO CAPS
1.25 | ORAL_CAPSULE | ORAL | 1 refills | Status: DC
Start: 2023-01-22 — End: 2023-09-19

## 2023-02-11 ENCOUNTER — Telehealth: Admit: 2023-02-11 | Discharge: 2023-02-11 | Payer: MEDICAID | Attending: Family Medicine | Primary: Family Medicine

## 2023-02-11 DIAGNOSIS — R6889 Other general symptoms and signs: Secondary | ICD-10-CM

## 2023-02-11 MED ORDER — OSELTAMIVIR PHOSPHATE 75 MG PO CAPS
75 | ORAL_CAPSULE | Freq: Two times a day (BID) | ORAL | 0 refills | Status: AC
Start: 2023-02-11 — End: 2023-02-16

## 2023-02-11 NOTE — Patient Instructions (Addendum)
It is likely you have a viral infection as 90% of upper respiratory infections are viral.  You do have many symptoms of the flu so I have sent Tamiflu to the pharmacy for you.  You will take this twice daily for 5 days.  The following treatments below are recommended for your symptoms.  I also recommend for you to test for COVID in 2 days and let me know if it is positive either Thursday or Friday at the latest.    -Vitamin C 1000 mg daily for 3 to 5 days  -Tylenol (500 mg every 6 hours or 1000 mg every 8 hours) OR ibuprofen (400 to 600 mg every 8 hours) for aches and chills and fever  -Netty pot/saline nasal rinses/Flonase for nasal congestion, sinus pressure, nasal drainage  -Cepacol throat lozenges, Vicks vapocool, or Chloraseptic throat spray for throat pain  -Mucinex for just chest congestion, or Mucinex DM for chest congestion and cough

## 2023-02-11 NOTE — Progress Notes (Signed)
Rebecca Lamb, was evaluated through a synchronous (real-time) audio-video encounter. The patient (or guardian if applicable) is aware that this is a billable service, which includes applicable co-pays. This Virtual Visit was conducted with patient's (and/or legal guardian's) consent. Patient identification was verified, and a caregiver was present when appropriate.   The patient was located at Home: [address]  [employer] [place] [address]  Provider was located at [employer] (Appt Dept): [address]  [address]  [place],  [place] [address]  Confirm you are appropriately licensed, registered, or certified to deliver care in the state where the patient is located as indicated above. If you are not or unsure, please re-schedule the visit: Yes, I confirm.     Rebecca Lamb (DOB:  [birth year]) is a Established patient, presenting virtually for evaluation of the following:    Chief Complaint   Patient presents with    Other     Sinus started yesterday morning terrible headache thought could clear it with tylenol and fluids is getting chills.        Below is the assessment and plan developed based on review of pertinent history, physical exam, labs, studies, and medications.     Assessment & Plan  Flu-like symptoms       Orders:    oseltamivir (TAMIFLU) 75 MG capsule; Take 1 capsule by mouth 2 times daily for 5 days      Return for RTN.     Patient has many symptoms of possible flu or COVID, treating for flu and recommended to test for COVID in 2 days.    Subjective     HPI  Patient presents today due to sickness for 2 days  Pertinent positives: sinus congestion, headache, mucus production, coughing, aches, chills, Tmax 102  Pertinent negatives: N/V/D, Sob, wheezing  Treatments tried: Tylenol  Sick Contacts: has been around grandchildren who are ill     Review of Systems       Objective   Patient-Reported Vitals  No data recorded     Physical Exam             --Thermon Leyland, MD

## 2023-03-17 ENCOUNTER — Ambulatory Visit: Admit: 2023-03-17 | Discharge: 2023-03-17 | Payer: MEDICAID | Attending: Family Medicine | Primary: Family Medicine

## 2023-03-17 VITALS — BP 124/74 | HR 76 | Ht 59.0 in | Wt 180.6 lb

## 2023-03-17 DIAGNOSIS — T7840XS Allergy, unspecified, sequela: Secondary | ICD-10-CM

## 2023-03-17 MED ORDER — LEVOCETIRIZINE DIHYDROCHLORIDE 5 MG PO TABS
5 | ORAL_TABLET | Freq: Every evening | ORAL | 5 refills | 90.00000 days | Status: DC
Start: 2023-03-17 — End: 2023-09-19

## 2023-03-17 NOTE — Progress Notes (Signed)
 03/17/2023        Rebecca Lamb June 13, 1970 is a 53 y.o. female who presents today with:  Chief Complaint   Patient presents with    Eye Burn     Few weeks        HPI:   Patient presents due to allergies.  She states that her eyes have been itching and burning for the past few weeks.  She has been using loratadine without relief.    Assessment & Plan   1. Allergy, sequela  -     levocetirizine (XYZAL) 5 MG tablet; Take 1 tablet by mouth nightly, Disp-30 tablet, R-5Normal  2. Encounter for screening mammogram for malignant neoplasm of breast  -     MAM TOMO DIGITAL SCREEN BILATERAL; Future     There are no discontinued medications.  Return if symptoms worsen or fail to improve.        Objective  Allergies   Allergen Reactions    Peanut Oil Anaphylaxis    Dog Epithelium (Canis Lupus Familiaris)      Other reaction(s): Unknown    Grass Pollen(K-O-R-T-Swt Vern)      Other reaction(s): Unknown    Ibuprofen      Nausea and vomiting      Pollen Extract      Other reaction(s): Unknown     Current Outpatient Medications   Medication Sig Dispense Refill    loratadine (CLARITIN) 10 MG tablet Take 1 tablet by mouth daily      levocetirizine (XYZAL) 5 MG tablet Take 1 tablet by mouth nightly 30 tablet 5    vitamin D (ERGOCALCIFEROL) 1.25 MG (50000 UT) CAPS capsule Take 1 capsule by mouth once a week 12 capsule 1    hydrOXYzine HCl (ATARAX) 25 MG tablet TAKE 1 TABLET BY MOUTH THREE TIMES A DAY AS NEEDED FOR ANXIETY 270 tablet 1    folic acid (FOLVITE) 1 MG tablet Take 1 tablet by mouth daily 90 tablet 1    atorvastatin (LIPITOR) 10 MG tablet Take 1 tablet by mouth daily 90 tablet 1    levothyroxine (SYNTHROID) 200 MCG tablet Take 1 tablet by mouth daily 90 tablet 1    metoprolol tartrate (LOPRESSOR) 25 MG tablet Take 1 tablet by mouth 2 times daily 180 tablet 1    meloxicam (MOBIC) 7.5 MG tablet Take 1 tablet by mouth 2 times daily 180 tablet 1    Ubrogepant (UBRELVY) 50 MG TABS Take 1 tablet by mouth as needed for migraine.   Can repeat every 2 hours until maximum of 200 mg / 24 hours. 30 tablet 5    escitalopram (LEXAPRO) 10 MG tablet TAKE 1 TABLET BY MOUTH EVERY DAY 90 tablet 0    methotrexate (RHEUMATREX) 2.5 MG chemo tablet Take by mouth once a week      VENTOLIN HFA 108 (90 BASE) MCG/ACT inhaler   6    albuterol (PROVENTIL) (2.5 MG/3ML) 0.083% nebulizer solution Take 3 mLs by nebulization every 6 hours as needed for Wheezing or Shortness of Breath      SUMAtriptan (IMITREX) 6 MG/0.5ML SOLN injection Inject 0.5 mLs into the skin once as needed for Migraine 4 mL 5     No current facility-administered medications for this visit.       PMH, Surgical Hx, Family Hx, and Social Hx reviewed and updated.  Health Maintenance reviewed.    Vitals:    03/17/23 1503   BP: 124/74   BP Site: Right Upper Arm  Patient Position: Sitting   BP Cuff Size: Medium Adult   Pulse: 76   SpO2: 98%   Weight: 81.9 kg (180 lb 9.6 oz)   Height: 1.499 m (4\' 11" )     BP Readings from Last 3 Encounters:   03/17/23 124/74   07/02/22 110/80   06/06/22 122/74     Wt Readings from Last 3 Encounters:   03/17/23 81.9 kg (180 lb 9.6 oz)   07/02/22 79.8 kg (176 lb)   06/06/22 78.9 kg (174 lb)       Lab Results   Component Value Date    LABA1C 5.5 01/17/2023    LABA1C 5.7 03/27/2022     Lab Results   Component Value Date    CREATININE 0.64 01/17/2023     Lab Results   Component Value Date    ALT 13 01/17/2023    AST 18 01/17/2023     Lab Results   Component Value Date    CHOL 138 07/02/2022    TRIG 136 07/02/2022    HDL 49 07/02/2022        Review of Systems   Physical Exam  Constitutional:       General: She is not in acute distress.     Appearance: Normal appearance. She is not toxic-appearing.   HENT:      Right Ear: External ear normal.      Left Ear: External ear normal.      Mouth/Throat:      Mouth: Mucous membranes are moist.   Eyes:      Extraocular Movements: Extraocular movements intact.      Pupils: Pupils are equal, round, and reactive to light.    Cardiovascular:      Rate and Rhythm: Normal rate and regular rhythm.      Pulses: Normal pulses.      Heart sounds: Normal heart sounds.   Pulmonary:      Effort: No respiratory distress.      Breath sounds: No wheezing.   Abdominal:      General: Bowel sounds are normal.      Palpations: Abdomen is soft.   Musculoskeletal:         General: Normal range of motion.   Skin:     General: Skin is warm and dry.   Neurological:      Mental Status: She is alert and oriented to person, place, and time. Mental status is at baseline.   Psychiatric:         Mood and Affect: Mood normal.         Behavior: Behavior normal.               Reviewed with the patient: current clinical status, medications, activities and diet.     Side effects, adverse effects of the medication prescribed today, as well as treatment plan/ rationale and result expectations have been discussed with the patient who expresses understanding and desires to proceed.    Close follow up to evaluate treatment results and for coordination of care.  I have reviewed the patient's medical history in detail and updated the computerized patient record.    Thermon Leyland, MD

## 2023-03-17 NOTE — Patient Instructions (Signed)
 If you receive an email to fill out a survey about our care here today, I would greatly appreciate it if you could fill it out for me, thank you!

## 2023-03-18 NOTE — Telephone Encounter (Signed)
 Pt called in and said that her anxiety had been really bad over the past day and that she wanted to know if something could be sent into help.    I spoke with Dr.Abukhater in person ab the request and he said that she could up her dosage on the hydrOXYzine HCl (ATARAX) 25 MG tablet .    Called pt and notified her.

## 2023-04-02 ENCOUNTER — Inpatient Hospital Stay: Admit: 2023-04-02 | Payer: MEDICAID | Primary: Family Medicine

## 2023-04-02 VITALS — Ht 59.02 in

## 2023-04-02 DIAGNOSIS — Z1231 Encounter for screening mammogram for malignant neoplasm of breast: Secondary | ICD-10-CM

## 2023-04-23 ENCOUNTER — Telehealth
Admit: 2023-04-23 | Discharge: 2023-04-23 | Payer: Medicaid (Managed Care) | Attending: Family Medicine | Primary: Family Medicine

## 2023-04-23 DIAGNOSIS — J019 Acute sinusitis, unspecified: Principal | ICD-10-CM

## 2023-04-23 MED ORDER — AZITHROMYCIN 250 MG PO TABS
250 | ORAL_TABLET | ORAL | 0 refills | Status: AC
Start: 2023-04-23 — End: 2023-05-03

## 2023-04-23 NOTE — Patient Instructions (Signed)
 If you receive an email to fill out a survey about our care here today, I would greatly appreciate it if you could fill it out for me, thank you!     It is likely you have a viral infection as 90% of upper respiratory infections are viral.  The following treatments below are recommended for your symptoms.  Please try these and reach out if your symptoms have not improved after 10 days from when they began.  -Zyrtec daily  -Vitamin C 500 mg 2-3 times daily for 3 to 5 days  -Tylenol (500 mg every 6 hours or 1000 mg every 8 hours) OR ibuprofen (400 to 600 mg every 8 hours) for aches and chills and fever  -Netty pot/saline nasal rinses/Flonase for nasal congestion, sinus pressure, nasal drainage  -Cepacol throat lozenges, Vicks vapocool, or Chloraseptic throat spray for throat pain  -Mucinex for just chest congestion, or Mucinex DM for chest congestion and cough

## 2023-04-23 NOTE — Progress Notes (Signed)
 Rebecca Lamb, was evaluated through a synchronous (real-time) audio-video encounter. The patient (or guardian if applicable) is aware that this is a billable service, which includes applicable co-pays. This Virtual Visit was conducted with patient's (and/or legal guardian's) consent. Patient identification was verified, and a caregiver was present when appropriate.   The patient was located at Home: [address]  [place] [address]  Provider was located at [employer] (Appt Dept): [address]  [address]  [place],  [place] [address]  Confirm you are appropriately licensed, registered, or certified to deliver care in the state where the patient is located as indicated above. If you are not or unsure, please re-schedule the visit: Yes, I confirm.     Rebecca Lamb (DOB:  1970-12-06) is a Established patient, presenting virtually for evaluation of the following:    Chief Complaint   Patient presents with    Other     Allergies in throat is coming in person Friday        Below is the assessment and plan developed based on review of pertinent history, physical exam, labs, studies, and medications.     Assessment & Plan  Allergy, sequela            Acute sinusitis, recurrence not specified, unspecified location       Orders:    azithromycin (ZITHROMAX) 250 MG tablet; 500mg  on day 1 followed by 250mg  on days 2 - 5      No follow-ups on file.       Subjective     HPI  Patient presents today due to sickness for 4 days  Pertinent positives: cough, itchy/watery eyes, nasal drainage, mucus  Pertinent negatives: Shortness of breath, wheezing, nausea, vomiting, diarrhea  Treatments tried: OTC meds  Sick Contacts: Unknown     Review of Systems       Objective   Patient-Reported Vitals  No data recorded     Physical Exam             --Lesia Raring, MD

## 2023-04-25 ENCOUNTER — Encounter: Payer: Medicaid (Managed Care) | Attending: Family Medicine | Primary: Family Medicine

## 2023-06-01 ENCOUNTER — Encounter

## 2023-06-03 ENCOUNTER — Encounter

## 2023-06-03 MED ORDER — SUMATRIPTAN SUCCINATE 6 MG/0.5ML SC SOAJ
6 | SUBCUTANEOUS | 5 refills | 30.00000 days | Status: DC
Start: 2023-06-03 — End: 2023-06-03

## 2023-06-03 MED ORDER — ALBUTEROL SULFATE HFA 108 (90 BASE) MCG/ACT IN AERS
108 | RESPIRATORY_TRACT | 5 refills | 25.00000 days | Status: AC
Start: 2023-06-03 — End: ?

## 2023-06-03 MED ORDER — SUMATRIPTAN SUCCINATE 6 MG/0.5ML SC SOLN
6 | Freq: Once | SUBCUTANEOUS | 5 refills | 30.00000 days | Status: AC | PRN
Start: 2023-06-03 — End: ?

## 2023-06-03 MED ORDER — ALBUTEROL SULFATE (2.5 MG/3ML) 0.083% IN NEBU
RESPIRATORY_TRACT | 3 refills | 25.00000 days | Status: AC
Start: 2023-06-03 — End: ?

## 2023-07-08 ENCOUNTER — Encounter: Payer: Medicaid (Managed Care) | Attending: Internal Medicine | Primary: Family Medicine

## 2023-07-13 ENCOUNTER — Encounter

## 2023-07-14 MED ORDER — FOLIC ACID 1 MG PO TABS
1 | ORAL_TABLET | Freq: Every day | ORAL | 1 refills | Status: AC
Start: 2023-07-14 — End: ?

## 2023-07-25 NOTE — Telephone Encounter (Signed)
 error

## 2023-09-06 ENCOUNTER — Encounter

## 2023-09-10 ENCOUNTER — Encounter

## 2023-09-11 MED ORDER — ATORVASTATIN CALCIUM 10 MG PO TABS
10 | ORAL_TABLET | Freq: Every day | ORAL | 0 refills | 90.00000 days | Status: DC
Start: 2023-09-11 — End: 2023-12-10

## 2023-09-11 MED ORDER — LEVOTHYROXINE SODIUM 200 MCG PO TABS
200 | ORAL_TABLET | Freq: Every day | ORAL | 0 refills | Status: AC
Start: 2023-09-11 — End: ?

## 2023-09-11 MED ORDER — ESCITALOPRAM OXALATE 10 MG PO TABS
10 | ORAL_TABLET | Freq: Every day | ORAL | 0 refills | Status: AC
Start: 2023-09-11 — End: ?

## 2023-09-19 ENCOUNTER — Telehealth
Admit: 2023-09-19 | Discharge: 2023-09-19 | Payer: Medicaid (Managed Care) | Attending: Family Medicine | Primary: Family Medicine

## 2023-09-19 MED ORDER — LEVOCETIRIZINE DIHYDROCHLORIDE 5 MG PO TABS
5 | ORAL_TABLET | Freq: Every evening | ORAL | 1 refills | Status: AC
Start: 2023-09-19 — End: ?

## 2023-09-19 MED ORDER — VITAMIN K2 100 MCG PO CAPS
100 | ORAL_CAPSULE | Freq: Every day | ORAL | 1 refills | Status: AC
Start: 2023-09-19 — End: 2024-03-17

## 2023-09-19 MED ORDER — MELOXICAM 7.5 MG PO TABS
7.5 | ORAL_TABLET | Freq: Two times a day (BID) | ORAL | 1 refills | Status: AC
Start: 2023-09-19 — End: 2024-03-17

## 2023-09-19 MED ORDER — UBRELVY 50 MG PO TABS
50 | ORAL_TABLET | ORAL | 5 refills | Status: AC
Start: 2023-09-19 — End: ?

## 2023-09-19 MED ORDER — HYDROXYZINE HCL 25 MG PO TABS
25 | ORAL_TABLET | Freq: Three times a day (TID) | ORAL | 1 refills | Status: AC | PRN
Start: 2023-09-19 — End: ?

## 2023-09-19 MED ORDER — METOPROLOL TARTRATE 25 MG PO TABS
25 | ORAL_TABLET | Freq: Two times a day (BID) | ORAL | 1 refills | Status: AC
Start: 2023-09-19 — End: 2024-03-17

## 2023-09-19 MED ORDER — CHOLECALCIFEROL 1.25 MG (50000 UT) PO CAPS
50000 | ORAL_CAPSULE | ORAL | 1 refills | Status: AC
Start: 2023-09-19 — End: ?

## 2023-09-19 NOTE — Assessment & Plan Note (Signed)
{  A/P [phone]::***}    Orders:    hydrOXYzine  HCl (ATARAX ) 25 MG tablet; Take 1 tablet by mouth 3 times daily as needed for Anxiety

## 2023-09-19 NOTE — Assessment & Plan Note (Signed)
{  A/P [phone]}    Orders:    T4, Free; Future    TSH; Future

## 2023-09-19 NOTE — Assessment & Plan Note (Signed)
{  A/P Summary:[phone]}    Orders:    Vitamin D  25 Hydroxy; Future    vitamin D  (CHOLECALCIFEROL ) 50000 UNIT CAPS; Take 1 capsule by mouth once a week With food    Menaquinone-7 (VITAMIN K2 ) 100 MCG CAPS; Take 1 capsule by mouth daily

## 2023-09-19 NOTE — Patient Instructions (Addendum)
 If you receive an email to fill out a survey about our care here today, I would greatly appreciate it if you could fill it out for me, thank you!     To treat your plantar fasciitis I recommend the following:  Get orthotics for both feet  Keep taking meloxicam  daily  Stretches including calf stretches against a wall or wrapping something around the foot to stretch it upwards  Rubbing your feet over a ball  Physical therapy    Please have your labs done, make sure to fast for 8 hours, only water or black coffee

## 2023-09-19 NOTE — Assessment & Plan Note (Signed)
{  A/P Summary:[phone]}

## 2023-09-19 NOTE — Progress Notes (Signed)
 Rebecca Lamb, was evaluated through a synchronous (real-time) audio-video encounter. The patient (or guardian if applicable) is aware that this is a billable service, which includes applicable co-pays. This Virtual Visit was conducted with patient's (and/or legal guardian's) consent. Patient identification was verified, and a caregiver was present when appropriate.   The patient was located at Home: [address]  [place] [address]  Provider was located at [employer] (Appt Dept): [address]  [address]  [place],  [place] [address]  Confirm you are appropriately licensed, registered, or certified to deliver care in the state where the patient is located as indicated above. If you are not or unsure, please re-schedule the visit: Yes, I confirm.     Rebecca Lamb (DOB:  September 28, 1970) is a Established patient, presenting virtually for evaluation of the following:    Chief Complaint   Patient presents with    Arthritis     Arthritis in hands and needs referral        Below is the assessment and plan developed based on review of pertinent history, physical exam, labs, studies, and medications.     Assessment & Plan  Rheumatoid arthritis, involving unspecified site, unspecified whether rheumatoid factor present [facility])       Orders:    [facility] - Misischia, Richard DO, Rheumatology - [place]    Current moderate episode of major depressive disorder, unspecified whether recurrent (HCC)            Allergy, sequela       Orders:    levocetirizine (XYZAL ) 5 MG tablet; Take 1 tablet by mouth nightly    Insomnia, unspecified type       Orders:    hydrOXYzine  HCl (ATARAX ) 25 MG tablet; Take 1 tablet by mouth 3 times daily as needed for Anxiety    Rheumatoid arthritis with positive rheumatoid factor, involving unspecified site (HCC)       Orders:    meloxicam  (MOBIC ) 7.5 MG tablet; Take 1 tablet by mouth 2 times daily    Lumbosacral disc disease       Orders:    meloxicam  (MOBIC ) 7.5 MG tablet; Take 1 tablet by mouth 2 times  daily    Essential hypertension       Orders:    metoprolol  tartrate (LOPRESSOR ) 25 MG tablet; Take 1 tablet by mouth 2 times daily    Migraine without status migrainosus, not intractable, unspecified migraine type       Orders:    Ubrogepant  (UBRELVY ) 50 MG TABS; Take 1 tablet by mouth as needed for migraine.  Can repeat every 2 hours until maximum of 200 mg / 24 hours.    Vitamin D  deficiency       Orders:    Vitamin D  25 Hydroxy; Future    vitamin D  (CHOLECALCIFEROL ) 50000 UNIT CAPS; Take 1 capsule by mouth once a week With food    Menaquinone-7 (VITAMIN K2 ) 100 MCG CAPS; Take 1 capsule by mouth daily    B12 deficiency       Orders:    Vitamin B12 & Folate; Future    Screening, lipid       Orders:    Lipid, Fasting; Future    Hypothyroidism, postsurgical       Orders:    T4, Free; Future    TSH; Future    GAD (generalized anxiety disorder)       Orders:    hydrOXYzine  HCl (ATARAX ) 25 MG tablet; Take 1 tablet by  mouth 3 times daily as needed for Anxiety      Return in about 6 months (around 03/18/2024) for RTN.       Subjective     HPI 83-month follow-up  PMH: Hypertension, migraines, depression, rheumatoid arthritis, allergic rhinitis, obesity, Asthma      Asthma  Patient denies any current wheezing, coughing, shortness of breath.  Tolerating inhalers well.      HTN  Patient denies any current chest pain, shortness of breath, palpitations, diaphoresis, lightheadedness.      Insomnia  has been using hydroxyzine  25 to 50 mg.     Migraines  she states that she gets migraines because of rheumatoid arthritis and she has associated nausea and vomiting.  She states that she gets migraines around once to twice weekly. She states that oral medications have not helped including Tylenol, ibuprofen , Excedrin, sumatriptan .  She has also failed sumatriptan  injection.  She currently uses Ubrelvy  which works well.     Rheumatoid arthritis  sees rheumatology but needs new referral.  Diagnosed with RA in [place]  ~2020 she had  inflammatory arthritis in hands, feet, knees. She has significant fatigue and hair thinning, dry mouth and hand/foot numbness. Positive ?serologies. She has been on methotrexate and Plaquenil with multiple interruptions. She is on daily folic acid .  Currently not taking methotrexate.     Anxiety/depression  In the past she has weaned off of Celexa.  She is on Lexapro  and hydroxyzine  now.  Patient denies any paranoia, visual or auditory hallucinations, suicidal or homicidal ideations.      Hypothyroidism  Last levels stable, due for recheck     Vitamin D  deficiency  Taking weekly vitamin D  50,000 units.  Last lab: 01/17/2023: 25     She complains of pain in both feet, more on the right specifically after taking a step out of bed in the morning which feels like sharp pain in the heels.  She takes meloxicam  daily.    Health Maintenance  Colonoscopy: Cologuard normal 06/2022  Pap smear: She has an OB/GYN and will follow-up with them  Mammogram: 04/03/2023, normal  Shingles VAX: Declined  Tetanus VAX: Declined    Review of Systems       Objective   Patient-Reported Vitals  Patient-Reported Weight: 176lbs       Physical Exam             --Rebecca GORMAN Regan, MD

## 2023-09-19 NOTE — Assessment & Plan Note (Signed)
{  A/P Summary:[phone]}    Orders:     - Misischia, Richard DO, Rheumatology - [place]

## 2023-12-10 ENCOUNTER — Encounter

## 2023-12-10 MED ORDER — ATORVASTATIN CALCIUM 10 MG PO TABS
10 | ORAL_TABLET | Freq: Every day | ORAL | 0 refills | 90.00000 days | Status: AC
Start: 2023-12-10 — End: ?
